# Patient Record
Sex: Female | Born: 1960 | Race: White | Hispanic: No | Smoking: Current every day smoker
Health system: Southern US, Community
[De-identification: ages and names within clinical notes are randomized; demographics above are authoritative.]

## PROBLEM LIST (undated history)

## (undated) ENCOUNTER — Emergency Department (HOSPITAL_COMMUNITY): Admission: EM | Payer: Self-pay | Source: Home / Self Care

## (undated) DIAGNOSIS — F32A Depression, unspecified: Secondary | ICD-10-CM

## (undated) DIAGNOSIS — M549 Dorsalgia, unspecified: Secondary | ICD-10-CM

## (undated) DIAGNOSIS — M51369 Other intervertebral disc degeneration, lumbar region without mention of lumbar back pain or lower extremity pain: Secondary | ICD-10-CM

## (undated) DIAGNOSIS — J449 Chronic obstructive pulmonary disease, unspecified: Secondary | ICD-10-CM

## (undated) DIAGNOSIS — F329 Major depressive disorder, single episode, unspecified: Secondary | ICD-10-CM

## (undated) DIAGNOSIS — M5136 Other intervertebral disc degeneration, lumbar region: Secondary | ICD-10-CM

## (undated) HISTORY — PX: CHOLECYSTECTOMY: SHX55

## (undated) HISTORY — PX: ABDOMINAL HYSTERECTOMY: SHX81

---

## 2008-03-13 ENCOUNTER — Emergency Department (HOSPITAL_COMMUNITY): Admission: EM | Admit: 2008-03-13 | Discharge: 2008-03-13 | Payer: Self-pay | Admitting: Emergency Medicine

## 2009-03-04 ENCOUNTER — Emergency Department (HOSPITAL_COMMUNITY): Admission: EM | Admit: 2009-03-04 | Discharge: 2009-03-04 | Payer: Self-pay | Admitting: Emergency Medicine

## 2009-03-20 ENCOUNTER — Emergency Department (HOSPITAL_COMMUNITY): Admission: EM | Admit: 2009-03-20 | Discharge: 2009-03-20 | Payer: Self-pay | Admitting: Emergency Medicine

## 2009-04-23 ENCOUNTER — Emergency Department (HOSPITAL_COMMUNITY): Admission: EM | Admit: 2009-04-23 | Discharge: 2009-04-24 | Payer: Self-pay | Admitting: Emergency Medicine

## 2009-06-26 ENCOUNTER — Emergency Department (HOSPITAL_COMMUNITY): Admission: EM | Admit: 2009-06-26 | Discharge: 2009-06-27 | Payer: Self-pay | Admitting: Emergency Medicine

## 2009-07-21 ENCOUNTER — Emergency Department (HOSPITAL_COMMUNITY): Admission: EM | Admit: 2009-07-21 | Discharge: 2009-07-22 | Payer: Self-pay | Admitting: Emergency Medicine

## 2009-07-30 ENCOUNTER — Emergency Department (HOSPITAL_COMMUNITY): Admission: EM | Admit: 2009-07-30 | Discharge: 2009-07-31 | Payer: Self-pay | Admitting: Emergency Medicine

## 2009-09-04 ENCOUNTER — Emergency Department (HOSPITAL_COMMUNITY): Admission: EM | Admit: 2009-09-04 | Discharge: 2009-09-04 | Payer: Self-pay | Admitting: Emergency Medicine

## 2009-09-07 ENCOUNTER — Ambulatory Visit: Payer: Self-pay | Admitting: Internal Medicine

## 2009-09-07 ENCOUNTER — Inpatient Hospital Stay (HOSPITAL_COMMUNITY): Admission: EM | Admit: 2009-09-07 | Discharge: 2009-09-07 | Payer: Self-pay | Admitting: Emergency Medicine

## 2009-10-08 ENCOUNTER — Emergency Department (HOSPITAL_COMMUNITY): Admission: EM | Admit: 2009-10-08 | Discharge: 2009-10-09 | Payer: Self-pay | Admitting: Emergency Medicine

## 2009-11-09 ENCOUNTER — Emergency Department (HOSPITAL_COMMUNITY): Admission: EM | Admit: 2009-11-09 | Discharge: 2009-11-09 | Payer: Self-pay | Admitting: Emergency Medicine

## 2009-11-23 ENCOUNTER — Emergency Department (HOSPITAL_COMMUNITY): Admission: EM | Admit: 2009-11-23 | Discharge: 2009-11-23 | Payer: Self-pay | Admitting: Emergency Medicine

## 2009-12-03 ENCOUNTER — Emergency Department (HOSPITAL_COMMUNITY): Admission: EM | Admit: 2009-12-03 | Discharge: 2009-12-03 | Payer: Self-pay | Admitting: Emergency Medicine

## 2009-12-19 ENCOUNTER — Emergency Department (HOSPITAL_COMMUNITY): Admission: EM | Admit: 2009-12-19 | Discharge: 2009-12-19 | Payer: Self-pay | Admitting: Emergency Medicine

## 2010-01-01 ENCOUNTER — Emergency Department (HOSPITAL_COMMUNITY): Admission: EM | Admit: 2010-01-01 | Discharge: 2010-01-01 | Payer: Self-pay | Admitting: Emergency Medicine

## 2010-01-01 ENCOUNTER — Encounter (INDEPENDENT_AMBULATORY_CARE_PROVIDER_SITE_OTHER): Payer: Self-pay | Admitting: Emergency Medicine

## 2010-01-01 ENCOUNTER — Ambulatory Visit: Payer: Self-pay | Admitting: Vascular Surgery

## 2010-01-13 ENCOUNTER — Emergency Department (HOSPITAL_COMMUNITY): Admission: EM | Admit: 2010-01-13 | Discharge: 2010-01-13 | Payer: Self-pay | Admitting: Emergency Medicine

## 2010-01-22 ENCOUNTER — Emergency Department (HOSPITAL_COMMUNITY): Admission: EM | Admit: 2010-01-22 | Discharge: 2010-01-23 | Payer: Self-pay | Admitting: Emergency Medicine

## 2010-03-18 ENCOUNTER — Emergency Department (HOSPITAL_COMMUNITY): Admission: EM | Admit: 2010-03-18 | Discharge: 2010-03-18 | Payer: Self-pay | Admitting: Emergency Medicine

## 2010-03-25 ENCOUNTER — Emergency Department (HOSPITAL_COMMUNITY): Admission: EM | Admit: 2010-03-25 | Discharge: 2010-03-25 | Payer: Self-pay | Admitting: Emergency Medicine

## 2010-04-12 ENCOUNTER — Emergency Department (HOSPITAL_COMMUNITY): Admission: EM | Admit: 2010-04-12 | Discharge: 2010-04-12 | Payer: Self-pay | Admitting: Emergency Medicine

## 2010-04-22 ENCOUNTER — Emergency Department (HOSPITAL_COMMUNITY): Admission: EM | Admit: 2010-04-22 | Discharge: 2010-04-22 | Payer: Self-pay | Admitting: Emergency Medicine

## 2010-12-04 ENCOUNTER — Emergency Department (HOSPITAL_COMMUNITY)
Admission: EM | Admit: 2010-12-04 | Discharge: 2010-12-05 | Disposition: A | Discharge status: Home / Self Care | Payer: Self-pay | Attending: Emergency Medicine | Admitting: Emergency Medicine

## 2010-12-04 DIAGNOSIS — M79609 Pain in unspecified limb: Secondary | ICD-10-CM | POA: Insufficient documentation

## 2010-12-04 DIAGNOSIS — J438 Other emphysema: Secondary | ICD-10-CM | POA: Insufficient documentation

## 2010-12-04 DIAGNOSIS — M545 Low back pain, unspecified: Secondary | ICD-10-CM | POA: Insufficient documentation

## 2010-12-04 DIAGNOSIS — Z79899 Other long term (current) drug therapy: Secondary | ICD-10-CM | POA: Insufficient documentation

## 2010-12-04 DIAGNOSIS — F3130 Bipolar disorder, current episode depressed, mild or moderate severity, unspecified: Secondary | ICD-10-CM | POA: Insufficient documentation

## 2010-12-04 DIAGNOSIS — G8929 Other chronic pain: Secondary | ICD-10-CM | POA: Insufficient documentation

## 2010-12-10 ENCOUNTER — Emergency Department (HOSPITAL_COMMUNITY)
Admission: EM | Admit: 2010-12-10 | Discharge: 2010-12-10 | Disposition: A | Discharge status: Home / Self Care | Payer: Self-pay | Attending: Emergency Medicine | Admitting: Emergency Medicine

## 2010-12-10 DIAGNOSIS — M79609 Pain in unspecified limb: Secondary | ICD-10-CM | POA: Insufficient documentation

## 2010-12-10 LAB — URINALYSIS, ROUTINE W REFLEX MICROSCOPIC
Bilirubin Urine: NEGATIVE
Ketones, ur: NEGATIVE mg/dL
Protein, ur: NEGATIVE mg/dL
Urine Glucose, Fasting: NEGATIVE mg/dL
Urobilinogen, UA: 0.2 mg/dL (ref 0.0–1.0)

## 2010-12-10 LAB — URINE MICROSCOPIC-ADD ON

## 2010-12-15 ENCOUNTER — Emergency Department (HOSPITAL_COMMUNITY)
Admission: EM | Admit: 2010-12-15 | Discharge: 2010-12-16 | Disposition: A | Discharge status: Home / Self Care | Payer: Self-pay | Attending: Emergency Medicine | Admitting: Emergency Medicine

## 2010-12-15 ENCOUNTER — Emergency Department: Payer: Self-pay | Admitting: Emergency Medicine

## 2010-12-28 LAB — CBC
HCT: 38.9 % (ref 36.0–46.0)
Hemoglobin: 13.5 g/dL (ref 12.0–15.0)
WBC: 8.6 10*3/uL (ref 4.0–10.5)

## 2010-12-28 LAB — PROTIME-INR
INR: 0.85 (ref 0.00–1.49)
Prothrombin Time: 11.5 seconds — ABNORMAL LOW (ref 11.6–15.2)

## 2010-12-28 LAB — DIFFERENTIAL
Basophils Absolute: 0.1 10*3/uL (ref 0.0–0.1)
Basophils Relative: 1 % (ref 0–1)
Eosinophils Absolute: 0.3 10*3/uL (ref 0.0–0.7)
Eosinophils Relative: 4 % (ref 0–5)
Monocytes Absolute: 0.5 10*3/uL (ref 0.1–1.0)

## 2010-12-28 LAB — POCT I-STAT, CHEM 8
Calcium, Ion: 1.17 mmol/L (ref 1.12–1.32)
Glucose, Bld: 95 mg/dL (ref 70–99)
HCT: 39 % (ref 36.0–46.0)
Hemoglobin: 13.3 g/dL (ref 12.0–15.0)
TCO2: 26 mmol/L (ref 0–100)

## 2010-12-28 LAB — D-DIMER, QUANTITATIVE: D-Dimer, Quant: <0.22 ug/mL-FEU (ref 0.00–0.48)

## 2010-12-29 LAB — URINALYSIS, ROUTINE W REFLEX MICROSCOPIC
Bilirubin Urine: NEGATIVE
Ketones, ur: NEGATIVE mg/dL
Nitrite: NEGATIVE
Specific Gravity, Urine: 1.004 — ABNORMAL LOW (ref 1.005–1.030)
Urobilinogen, UA: 0.2 mg/dL (ref 0.0–1.0)
pH: 6.5 (ref 5.0–8.0)

## 2010-12-29 LAB — CBC
Hemoglobin: 13.7 g/dL (ref 12.0–15.0)
MCHC: 35 g/dL (ref 30.0–36.0)
MCV: 87.3 fL (ref 78.0–100.0)
RBC: 4.47 MIL/uL (ref 3.87–5.11)

## 2010-12-29 LAB — POCT I-STAT, CHEM 8
Creatinine, Ser: 0.7 mg/dL (ref 0.4–1.2)
Hemoglobin: 13.6 g/dL (ref 12.0–15.0)
Sodium: 141 mEq/L (ref 135–145)
TCO2: 25 mmol/L (ref 0–100)

## 2010-12-29 LAB — DIFFERENTIAL
Basophils Relative: 1 % (ref 0–1)
Eosinophils Absolute: 0.3 10*3/uL (ref 0.0–0.7)
Monocytes Absolute: 0.5 10*3/uL (ref 0.1–1.0)
Monocytes Relative: 5 % (ref 3–12)

## 2010-12-29 LAB — HEPATIC FUNCTION PANEL
AST: 35 U/L (ref 0–37)
Bilirubin, Direct: <0.1 mg/dL (ref 0.0–0.3)
Total Bilirubin: 0.3 mg/dL (ref 0.3–1.2)

## 2010-12-29 LAB — URINE MICROSCOPIC-ADD ON

## 2010-12-29 LAB — LIPASE, BLOOD: Lipase: 22 U/L (ref 11–59)

## 2011-01-05 ENCOUNTER — Emergency Department (HOSPITAL_COMMUNITY)
Admission: EM | Admit: 2011-01-05 | Discharge: 2011-01-05 | Disposition: A | Discharge status: Home / Self Care | Payer: Self-pay | Attending: Emergency Medicine | Admitting: Emergency Medicine

## 2011-01-05 DIAGNOSIS — Z79899 Other long term (current) drug therapy: Secondary | ICD-10-CM | POA: Insufficient documentation

## 2011-01-05 DIAGNOSIS — F3130 Bipolar disorder, current episode depressed, mild or moderate severity, unspecified: Secondary | ICD-10-CM | POA: Insufficient documentation

## 2011-01-05 DIAGNOSIS — J438 Other emphysema: Secondary | ICD-10-CM | POA: Insufficient documentation

## 2011-01-05 DIAGNOSIS — M5430 Sciatica, unspecified side: Secondary | ICD-10-CM | POA: Insufficient documentation

## 2011-01-05 DIAGNOSIS — M549 Dorsalgia, unspecified: Secondary | ICD-10-CM | POA: Insufficient documentation

## 2011-01-12 LAB — COMPREHENSIVE METABOLIC PANEL
Alkaline Phosphatase: 104 U/L (ref 39–117)
BUN: 15 mg/dL (ref 6–23)
CO2: 22 mEq/L (ref 19–32)
Chloride: 104 mEq/L (ref 96–112)
Creatinine, Ser: 0.78 mg/dL (ref 0.4–1.2)
GFR calc non Af Amer: >60 mL/min (ref >60)
Glucose, Bld: 134 mg/dL — ABNORMAL HIGH (ref 70–99)
Total Bilirubin: 0.7 mg/dL (ref 0.3–1.2)

## 2011-01-12 LAB — DIFFERENTIAL
Basophils Absolute: 0.1 10*3/uL (ref 0.0–0.1)
Basophils Relative: 1 % (ref 0–1)
Lymphocytes Relative: 27 % (ref 12–46)
Neutro Abs: 8.7 10*3/uL — ABNORMAL HIGH (ref 1.7–7.7)
Neutrophils Relative %: 64 % (ref 43–77)

## 2011-01-12 LAB — CBC
HCT: 38.5 % (ref 36.0–46.0)
Hemoglobin: 13.7 g/dL (ref 12.0–15.0)
MCV: 88 fL (ref 78.0–100.0)
Platelets: 319 10*3/uL (ref 150–400)
RBC: 4.37 MIL/uL (ref 3.87–5.11)
WBC: 13.6 10*3/uL — ABNORMAL HIGH (ref 4.0–10.5)

## 2011-01-12 LAB — URINALYSIS, ROUTINE W REFLEX MICROSCOPIC
Ketones, ur: NEGATIVE mg/dL
Protein, ur: NEGATIVE mg/dL
Urobilinogen, UA: 0.2 mg/dL (ref 0.0–1.0)

## 2011-01-12 LAB — URINE MICROSCOPIC-ADD ON

## 2011-01-14 LAB — CARDIAC PANEL(CRET KIN+CKTOT+MB+TROPI)
CK, MB: 1.1 ng/mL (ref 0.3–4.0)
Total CK: 50 U/L (ref 7–177)

## 2011-01-14 LAB — DIFFERENTIAL
Basophils Absolute: 0 K/uL (ref 0.0–0.1)
Basophils Relative: 0 % (ref 0–1)
Eosinophils Absolute: 0.2 10*3/uL (ref 0.0–0.7)
Eosinophils Relative: 3 % (ref 0–5)
Eosinophils Relative: 2 % (ref 0–5)
Lymphocytes Relative: 22 % (ref 12–46)
Lymphocytes Relative: 29 % (ref 12–46)
Lymphs Abs: 1.8 10*3/uL (ref 0.7–4.0)
Lymphs Abs: 3.1 10*3/uL (ref 0.7–4.0)
Monocytes Absolute: 0.5 10*3/uL (ref 0.1–1.0)
Monocytes Absolute: 0.6 K/uL (ref 0.1–1.0)
Monocytes Relative: 5 % (ref 3–12)
Neutro Abs: 7 K/uL (ref 1.7–7.7)
Neutrophils Relative %: 64 % (ref 43–77)

## 2011-01-14 LAB — URINALYSIS, ROUTINE W REFLEX MICROSCOPIC
Bilirubin Urine: NEGATIVE
Glucose, UA: NEGATIVE mg/dL
Ketones, ur: NEGATIVE mg/dL
Protein, ur: NEGATIVE mg/dL
pH: 5.5 (ref 5.0–8.0)

## 2011-01-14 LAB — URINE CULTURE
Colony Count: >=100000
Special Requests: NEGATIVE

## 2011-01-14 LAB — CBC
HCT: 35 % — ABNORMAL LOW (ref 36.0–46.0)
HCT: 35.5 % — ABNORMAL LOW (ref 36.0–46.0)
HCT: 37.8 % (ref 36.0–46.0)
Hemoglobin: 12.4 g/dL (ref 12.0–15.0)
Hemoglobin: 12.6 g/dL (ref 12.0–15.0)
Hemoglobin: 13.1 g/dL (ref 12.0–15.0)
MCHC: 35.4 g/dL (ref 30.0–36.0)
MCHC: 34.6 g/dL (ref 30.0–36.0)
MCV: 88.9 fL (ref 78.0–100.0)
Platelets: 250 10*3/uL (ref 150–400)
Platelets: 297 K/uL (ref 150–400)
RBC: 3.96 MIL/uL (ref 3.87–5.11)
RBC: 4.24 MIL/uL (ref 3.87–5.11)
RDW: 12.9 % (ref 11.5–15.5)
RDW: 13 % (ref 11.5–15.5)
RDW: 13.3 % (ref 11.5–15.5)
WBC: 8.5 10*3/uL (ref 4.0–10.5)
WBC: 10.9 10*3/uL — ABNORMAL HIGH (ref 4.0–10.5)

## 2011-01-14 LAB — COMPREHENSIVE METABOLIC PANEL
AST: 24 U/L (ref 0–37)
Albumin: 3.3 g/dL — ABNORMAL LOW (ref 3.5–5.2)
Calcium: 8.3 mg/dL — ABNORMAL LOW (ref 8.4–10.5)
Creatinine, Ser: 0.76 mg/dL (ref 0.4–1.2)
GFR calc non Af Amer: >60 mL/min (ref >60)
Sodium: 138 mEq/L (ref 135–145)

## 2011-01-14 LAB — BRAIN NATRIURETIC PEPTIDE
Pro B Natriuretic peptide (BNP): 48 pg/mL (ref 0.0–100.0)
Pro B Natriuretic peptide (BNP): 76 pg/mL (ref 0.0–100.0)

## 2011-01-14 LAB — POCT CARDIAC MARKERS
CKMB, poc: <1 ng/mL — ABNORMAL LOW (ref 1.0–8.0)
Myoglobin, poc: 31 ng/mL (ref 12–200)
Myoglobin, poc: 39.2 ng/mL (ref 12–200)
Troponin i, poc: <0.05 ng/mL (ref 0.00–0.09)
Troponin i, poc: <0.05 ng/mL (ref 0.00–0.09)

## 2011-01-14 LAB — POCT I-STAT, CHEM 8
BUN: 17 mg/dL (ref 6–23)
Calcium, Ion: 1.03 mmol/L — ABNORMAL LOW (ref 1.12–1.32)
Chloride: 107 mEq/L (ref 96–112)
Creatinine, Ser: 0.9 mg/dL (ref 0.4–1.2)
Glucose, Bld: 123 mg/dL — ABNORMAL HIGH (ref 70–99)
HCT: 37 % (ref 36.0–46.0)
Hemoglobin: 12.6 g/dL (ref 12.0–15.0)
Potassium: 3.7 meq/L (ref 3.5–5.1)
Sodium: 139 mEq/L (ref 135–145)
TCO2: 22 mmol/L (ref 0–100)

## 2011-01-14 LAB — HEMOGLOBIN A1C: Mean Plasma Glucose: 120 mg/dL

## 2011-01-14 LAB — LIPID PANEL
Cholesterol: 243 mg/dL — ABNORMAL HIGH (ref 0–200)
LDL Cholesterol: 164 mg/dL — ABNORMAL HIGH (ref 0–99)
Total CHOL/HDL Ratio: 5.8 RATIO

## 2011-01-14 LAB — URINE MICROSCOPIC-ADD ON

## 2011-01-14 LAB — POCT PREGNANCY, URINE: Preg Test, Ur: NEGATIVE

## 2011-01-14 LAB — D-DIMER, QUANTITATIVE: D-Dimer, Quant: 0.52 ug/mL-FEU — ABNORMAL HIGH (ref 0.00–0.48)

## 2011-01-15 LAB — POCT CARDIAC MARKERS
CKMB, poc: <1 ng/mL — ABNORMAL LOW (ref 1.0–8.0)
Myoglobin, poc: 39.9 ng/mL (ref 12–200)
Troponin i, poc: <0.05 ng/mL (ref 0.00–0.09)

## 2011-01-15 LAB — COMPREHENSIVE METABOLIC PANEL
AST: 15 U/L (ref 0–37)
Albumin: 3.3 g/dL — ABNORMAL LOW (ref 3.5–5.2)
Alkaline Phosphatase: 85 U/L (ref 39–117)
CO2: 30 mEq/L (ref 19–32)
Chloride: 107 mEq/L (ref 96–112)
Creatinine, Ser: 0.98 mg/dL (ref 0.4–1.2)
GFR calc Af Amer: >60 mL/min (ref >60)
GFR calc non Af Amer: >60 mL/min (ref >60)
Potassium: 4.3 mEq/L (ref 3.5–5.1)
Total Bilirubin: 0.4 mg/dL (ref 0.3–1.2)

## 2011-01-15 LAB — CBC
HCT: 36.5 % (ref 36.0–46.0)
MCV: 89.8 fL (ref 78.0–100.0)
Platelets: 282 10*3/uL (ref 150–400)
RBC: 4.07 MIL/uL (ref 3.87–5.11)
WBC: 11.5 10*3/uL — ABNORMAL HIGH (ref 4.0–10.5)

## 2011-01-15 LAB — DIFFERENTIAL
Basophils Absolute: 0 10*3/uL (ref 0.0–0.1)
Basophils Relative: 0 % (ref 0–1)
Eosinophils Absolute: 0.2 10*3/uL (ref 0.0–0.7)
Eosinophils Relative: 2 % (ref 0–5)
Lymphocytes Relative: 31 % (ref 12–46)
Monocytes Absolute: 0.5 10*3/uL (ref 0.1–1.0)

## 2011-01-15 LAB — D-DIMER, QUANTITATIVE: D-Dimer, Quant: 0.34 ug/mL-FEU (ref 0.00–0.48)

## 2011-01-16 ENCOUNTER — Emergency Department (HOSPITAL_COMMUNITY)
Admission: EM | Admit: 2011-01-16 | Discharge: 2011-01-16 | Disposition: A | Discharge status: Home / Self Care | Payer: Self-pay | Attending: Emergency Medicine | Admitting: Emergency Medicine

## 2011-01-16 DIAGNOSIS — M545 Low back pain, unspecified: Secondary | ICD-10-CM | POA: Insufficient documentation

## 2011-01-16 DIAGNOSIS — J438 Other emphysema: Secondary | ICD-10-CM | POA: Insufficient documentation

## 2011-01-16 DIAGNOSIS — G8929 Other chronic pain: Secondary | ICD-10-CM | POA: Insufficient documentation

## 2011-01-16 DIAGNOSIS — R32 Unspecified urinary incontinence: Secondary | ICD-10-CM | POA: Insufficient documentation

## 2011-01-16 DIAGNOSIS — Z79899 Other long term (current) drug therapy: Secondary | ICD-10-CM | POA: Insufficient documentation

## 2011-01-16 DIAGNOSIS — F3130 Bipolar disorder, current episode depressed, mild or moderate severity, unspecified: Secondary | ICD-10-CM | POA: Insufficient documentation

## 2011-03-05 ENCOUNTER — Emergency Department (HOSPITAL_COMMUNITY): Payer: Self-pay

## 2011-03-05 ENCOUNTER — Emergency Department (HOSPITAL_COMMUNITY)
Admission: EM | Admit: 2011-03-05 | Discharge: 2011-03-05 | Discharge status: Against Medical Advice | Payer: Self-pay | Attending: Emergency Medicine | Admitting: Emergency Medicine

## 2011-03-05 DIAGNOSIS — G8929 Other chronic pain: Secondary | ICD-10-CM | POA: Insufficient documentation

## 2011-03-05 DIAGNOSIS — R29898 Other symptoms and signs involving the musculoskeletal system: Secondary | ICD-10-CM | POA: Insufficient documentation

## 2011-03-05 DIAGNOSIS — M546 Pain in thoracic spine: Secondary | ICD-10-CM | POA: Insufficient documentation

## 2011-03-05 DIAGNOSIS — M545 Low back pain, unspecified: Secondary | ICD-10-CM | POA: Insufficient documentation

## 2011-03-05 DIAGNOSIS — J438 Other emphysema: Secondary | ICD-10-CM | POA: Insufficient documentation

## 2011-03-05 DIAGNOSIS — F3130 Bipolar disorder, current episode depressed, mild or moderate severity, unspecified: Secondary | ICD-10-CM | POA: Insufficient documentation

## 2011-03-05 DIAGNOSIS — R159 Full incontinence of feces: Secondary | ICD-10-CM | POA: Insufficient documentation

## 2011-03-05 LAB — URINE MICROSCOPIC-ADD ON

## 2011-03-05 LAB — URINALYSIS, ROUTINE W REFLEX MICROSCOPIC
Bilirubin Urine: NEGATIVE
Glucose, UA: NEGATIVE mg/dL
Ketones, ur: NEGATIVE mg/dL
Nitrite: POSITIVE — AB
Protein, ur: NEGATIVE mg/dL
Specific Gravity, Urine: 1.024 (ref 1.005–1.030)
Urobilinogen, UA: 0.2 mg/dL (ref 0.0–1.0)
pH: 6 (ref 5.0–8.0)

## 2011-03-05 LAB — RAPID URINE DRUG SCREEN, HOSP PERFORMED
Amphetamines: NOT DETECTED
Barbiturates: NOT DETECTED
Benzodiazepines: NOT DETECTED
Cocaine: NOT DETECTED
Opiates: NOT DETECTED
Tetrahydrocannabinol: NOT DETECTED

## 2011-03-05 LAB — POCT I-STAT, CHEM 8
BUN: 9 mg/dL (ref 6–23)
Calcium, Ion: 1.1 mmol/L — ABNORMAL LOW (ref 1.12–1.32)
Chloride: 108 mEq/L (ref 96–112)
Creatinine, Ser: 1 mg/dL (ref 0.4–1.2)
Glucose, Bld: 105 mg/dL — ABNORMAL HIGH (ref 70–99)
HCT: 38 % (ref 36.0–46.0)
Hemoglobin: 12.9 g/dL (ref 12.0–15.0)
Potassium: 3.5 mEq/L (ref 3.5–5.1)
Sodium: 143 mEq/L (ref 135–145)
TCO2: 24 mmol/L (ref 0–100)

## 2011-03-07 LAB — URINE CULTURE
Colony Count: >=100000
Culture  Setup Time: 201205242355

## 2011-03-12 ENCOUNTER — Emergency Department (HOSPITAL_COMMUNITY)
Admission: EM | Admit: 2011-03-12 | Discharge: 2011-03-12 | Disposition: A | Discharge status: Home / Self Care | Payer: Self-pay | Attending: Emergency Medicine | Admitting: Emergency Medicine

## 2011-03-12 DIAGNOSIS — J438 Other emphysema: Secondary | ICD-10-CM | POA: Insufficient documentation

## 2011-03-12 DIAGNOSIS — F3130 Bipolar disorder, current episode depressed, mild or moderate severity, unspecified: Secondary | ICD-10-CM | POA: Insufficient documentation

## 2011-03-12 DIAGNOSIS — M545 Low back pain, unspecified: Secondary | ICD-10-CM | POA: Insufficient documentation

## 2011-03-12 DIAGNOSIS — E669 Obesity, unspecified: Secondary | ICD-10-CM | POA: Insufficient documentation

## 2011-03-12 DIAGNOSIS — R209 Unspecified disturbances of skin sensation: Secondary | ICD-10-CM | POA: Insufficient documentation

## 2011-03-12 DIAGNOSIS — G8929 Other chronic pain: Secondary | ICD-10-CM | POA: Insufficient documentation

## 2011-04-01 ENCOUNTER — Emergency Department (HOSPITAL_COMMUNITY): Payer: Self-pay

## 2011-04-01 ENCOUNTER — Emergency Department (HOSPITAL_COMMUNITY)
Admission: EM | Admit: 2011-04-01 | Discharge: 2011-04-01 | Disposition: A | Discharge status: Home / Self Care | Payer: Self-pay | Attending: Emergency Medicine | Admitting: Emergency Medicine

## 2011-04-01 DIAGNOSIS — R0602 Shortness of breath: Secondary | ICD-10-CM | POA: Insufficient documentation

## 2011-04-01 DIAGNOSIS — J4489 Other specified chronic obstructive pulmonary disease: Secondary | ICD-10-CM | POA: Insufficient documentation

## 2011-04-01 DIAGNOSIS — J449 Chronic obstructive pulmonary disease, unspecified: Secondary | ICD-10-CM | POA: Insufficient documentation

## 2011-04-01 DIAGNOSIS — M546 Pain in thoracic spine: Secondary | ICD-10-CM | POA: Insufficient documentation

## 2011-04-01 DIAGNOSIS — R059 Cough, unspecified: Secondary | ICD-10-CM | POA: Insufficient documentation

## 2011-04-01 DIAGNOSIS — R05 Cough: Secondary | ICD-10-CM | POA: Insufficient documentation

## 2011-04-01 DIAGNOSIS — R609 Edema, unspecified: Secondary | ICD-10-CM | POA: Insufficient documentation

## 2011-04-01 DIAGNOSIS — G8929 Other chronic pain: Secondary | ICD-10-CM | POA: Insufficient documentation

## 2011-04-01 DIAGNOSIS — R079 Chest pain, unspecified: Secondary | ICD-10-CM | POA: Insufficient documentation

## 2011-04-01 DIAGNOSIS — F3130 Bipolar disorder, current episode depressed, mild or moderate severity, unspecified: Secondary | ICD-10-CM | POA: Insufficient documentation

## 2011-04-01 LAB — CBC
HCT: 40.8 % (ref 36.0–46.0)
Hemoglobin: 13.8 g/dL (ref 12.0–15.0)
MCH: 29.5 pg (ref 26.0–34.0)
MCV: 87.2 fL (ref 78.0–100.0)
RBC: 4.68 MIL/uL (ref 3.87–5.11)

## 2011-04-01 LAB — POCT I-STAT, CHEM 8
BUN: 10 mg/dL (ref 6–23)
Chloride: 107 mEq/L (ref 96–112)
Creatinine, Ser: 0.9 mg/dL (ref 0.50–1.10)
Sodium: 141 mEq/L (ref 135–145)

## 2011-04-01 LAB — TROPONIN I: Troponin I: <0.3 ng/mL (ref <0.30)

## 2011-04-01 LAB — DIFFERENTIAL
Lymphs Abs: 3.7 10*3/uL (ref 0.7–4.0)
Monocytes Relative: 5 % (ref 3–12)
Neutro Abs: 6 10*3/uL (ref 1.7–7.7)
Neutrophils Relative %: 58 % (ref 43–77)

## 2011-06-04 IMAGING — CR DG KNEE COMPLETE 4+V*R*
7 series · 7 of 7 positions shown · non-contrast
Comparison: 01/01/2010

CLINICAL DATA: Right knee giving out on her for 6 months.  Anterior
knee pain.  Tibial rod 0440 after trauma.

RIGHT KNEE - COMPLETE 4+ VIEW

[t knee ap right (1 of 3)]
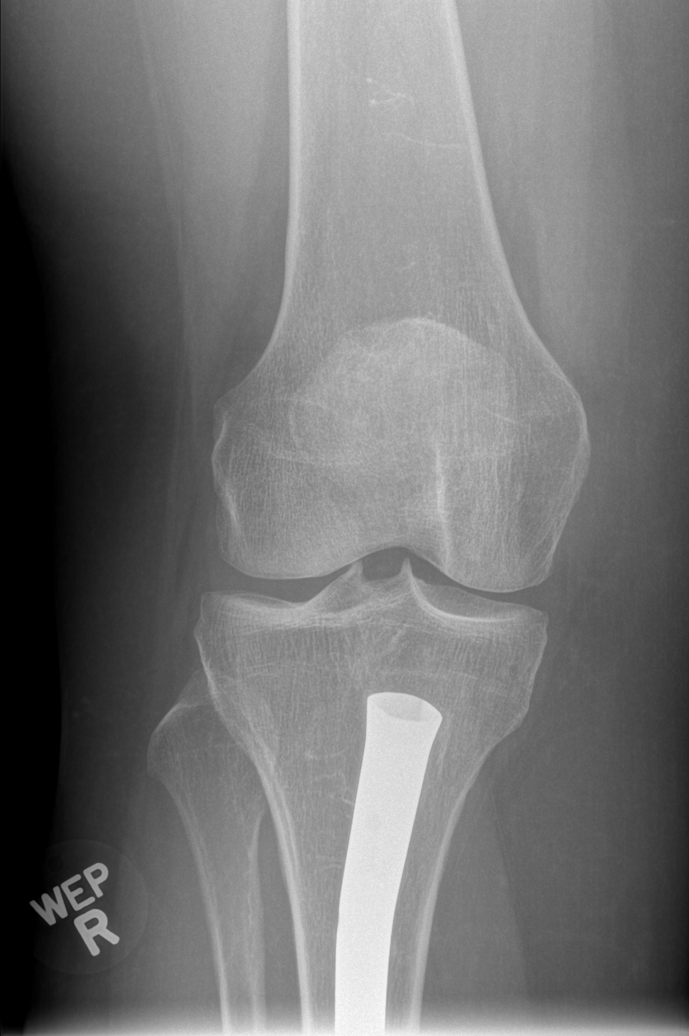

[t knee ap right (2 of 3)]
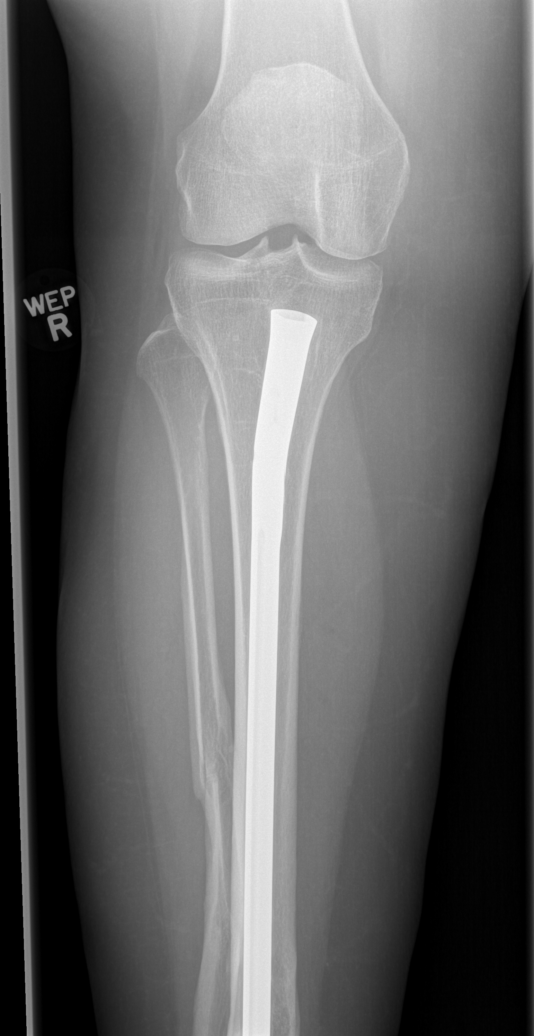

[t knee ap right (3 of 3)]
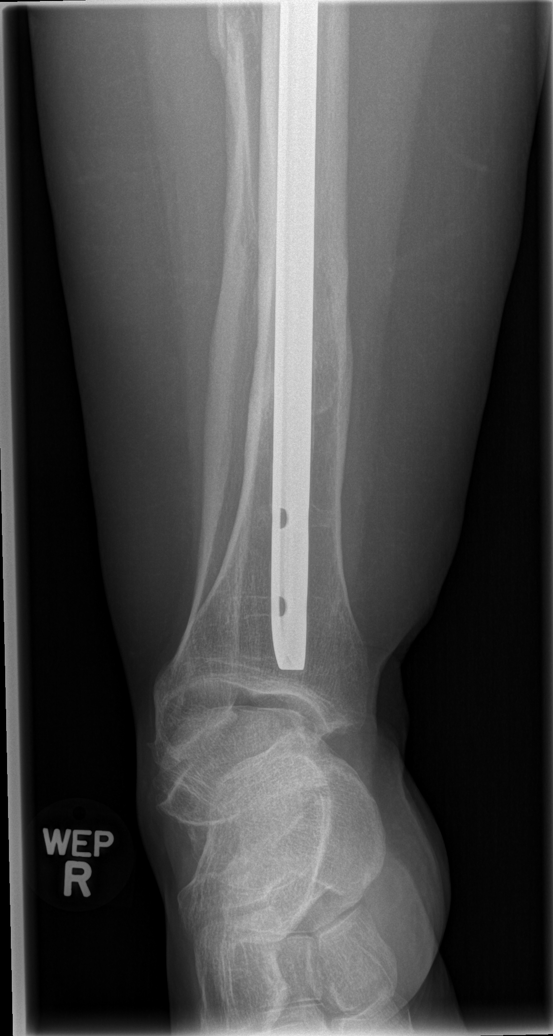

[t knee oblique right (1 of 2)]
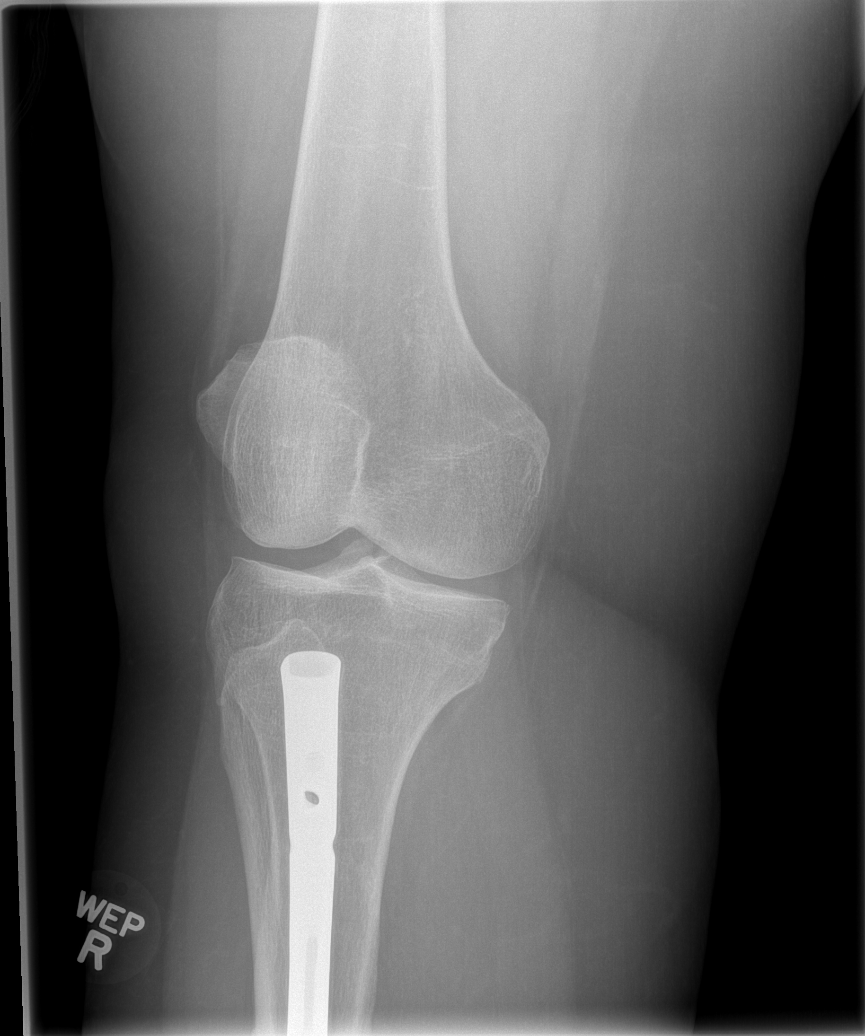

[t knee oblique right (2 of 2)]
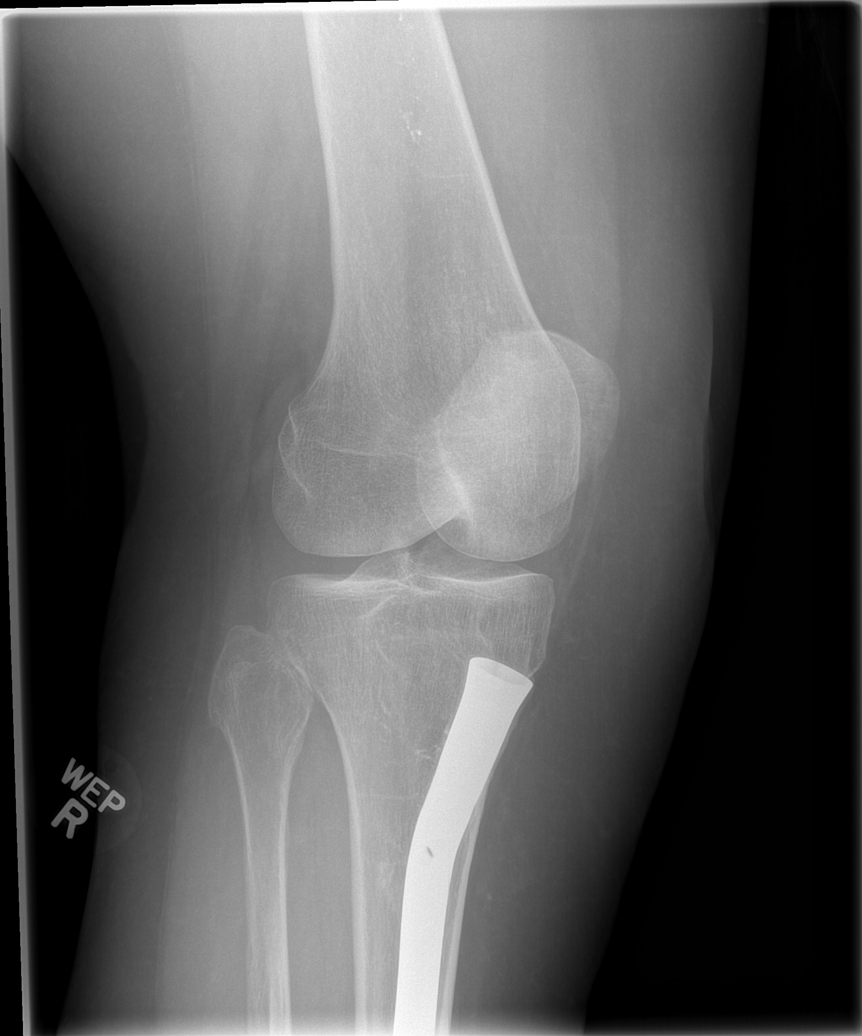

[t knee lat right (1 of 2)]
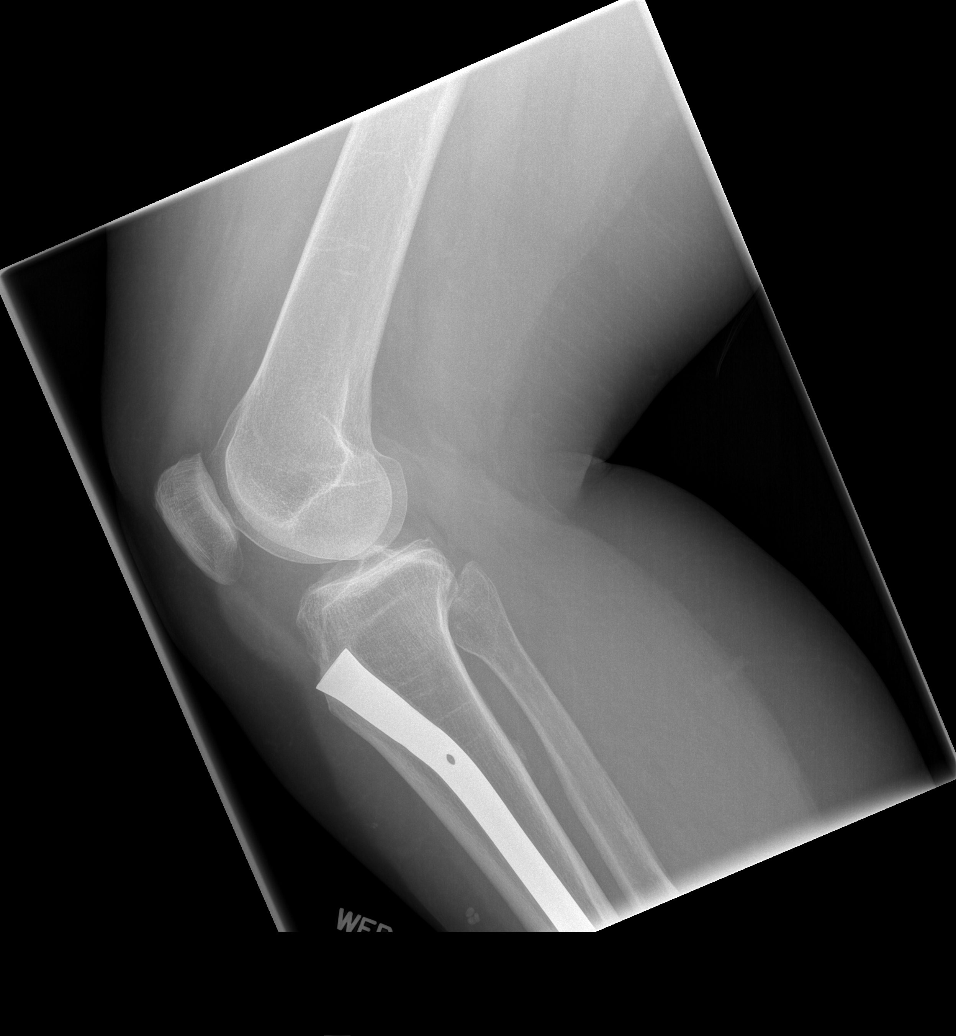

[t knee lat right (2 of 2)]
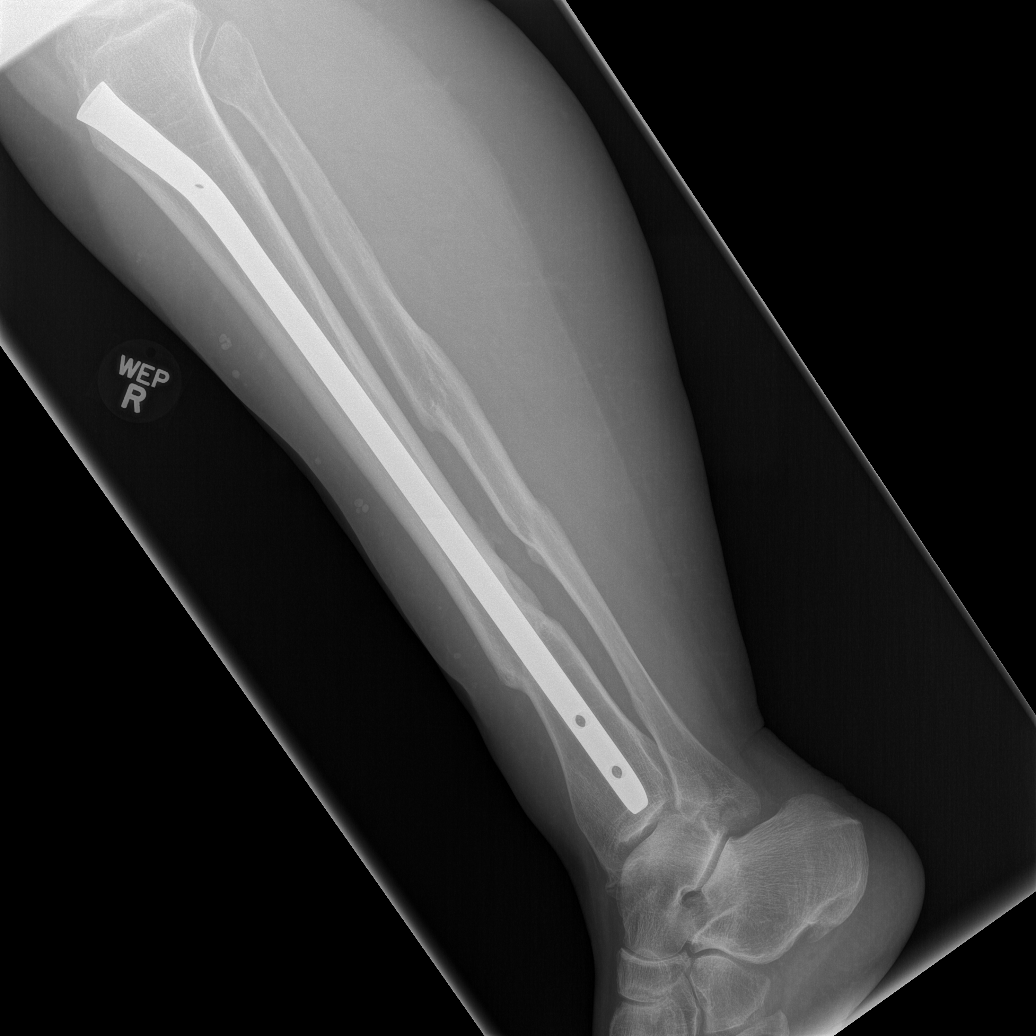

[7 of 7 positions shown; findings below may reference images not displayed]

FINDINGS: Four views are performed, showing tibial rod traversing
the length of the tibia.  There is deformity of the tibia and
fibula consistent with previous fractures.  There is no evidence
for acute fracture or subluxation.  No evidence for joint effusion.
IMPRESSION: Postoperative changes. No evidence for acute  abnormality.

## 2011-07-15 ENCOUNTER — Emergency Department (HOSPITAL_COMMUNITY)
Admission: EM | Admit: 2011-07-15 | Discharge: 2011-07-15 | Disposition: A | Discharge status: Home / Self Care | Payer: Self-pay | Attending: Emergency Medicine | Admitting: Emergency Medicine

## 2011-07-15 ENCOUNTER — Encounter: Payer: Self-pay | Admitting: Emergency Medicine

## 2011-07-15 DIAGNOSIS — M545 Low back pain, unspecified: Secondary | ICD-10-CM | POA: Insufficient documentation

## 2011-07-15 DIAGNOSIS — M79609 Pain in unspecified limb: Secondary | ICD-10-CM | POA: Insufficient documentation

## 2011-07-15 DIAGNOSIS — F17200 Nicotine dependence, unspecified, uncomplicated: Secondary | ICD-10-CM | POA: Insufficient documentation

## 2011-07-15 DIAGNOSIS — M5416 Radiculopathy, lumbar region: Secondary | ICD-10-CM

## 2011-07-15 DIAGNOSIS — IMO0002 Reserved for concepts with insufficient information to code with codable children: Secondary | ICD-10-CM | POA: Insufficient documentation

## 2011-07-15 DIAGNOSIS — Z79899 Other long term (current) drug therapy: Secondary | ICD-10-CM | POA: Insufficient documentation

## 2011-07-15 HISTORY — DX: Major depressive disorder, single episode, unspecified: F32.9

## 2011-07-15 HISTORY — DX: Chronic obstructive pulmonary disease, unspecified: J44.9

## 2011-07-15 HISTORY — DX: Depression, unspecified: F32.A

## 2011-07-15 HISTORY — DX: Dorsalgia, unspecified: M54.9

## 2011-07-15 MED ORDER — OXYCODONE-ACETAMINOPHEN 5-325 MG PO TABS
1.0000 | ORAL_TABLET | Freq: Once | ORAL | Status: AC
  Administered 2011-07-15: 1 via ORAL
  Filled 2011-07-15: qty 1

## 2011-07-15 MED ORDER — CYCLOBENZAPRINE HCL 5 MG PO TABS: 5.0000 mg | ORAL_TABLET | Freq: Three times a day (TID) | ORAL | Status: AC | PRN

## 2011-07-15 MED ORDER — OXYCODONE-ACETAMINOPHEN 5-325 MG PO TABS: 1.0000 | ORAL_TABLET | ORAL | Status: AC | PRN

## 2011-07-15 MED ORDER — PREDNISONE 20 MG PO TABS: 60.0000 mg | ORAL_TABLET | Freq: Every day | ORAL | Status: AC

## 2011-07-15 MED ORDER — PREDNISONE 20 MG PO TABS
60.0000 mg | ORAL_TABLET | Freq: Once | ORAL | Status: AC
  Administered 2011-07-15: 60 mg via ORAL
  Filled 2011-07-15: qty 3

## 2011-07-15 NOTE — ED Notes (Signed)
Pt c/o chronic lower back pain with rt leg pain.

## 2011-07-15 NOTE — ED Notes (Signed)
Julie Idol, PA at bedside 

## 2011-07-15 NOTE — ED Notes (Signed)
Patient with no complaints at this time. Respirations even and unlabored. Skin warm/dry. Discharge instructions reviewed with patient at this time. Patient given opportunity to voice concerns/ask questions. Patient discharged at this time and left Emergency Department with steady gait.   

## 2011-07-15 NOTE — ED Provider Notes (Signed)
History     CSN: [id number] Arrival date & time: 07/15/2011  2:00 PM  Chief Complaint  Patient presents with  . Back Pain  . Leg Pain    (Consider location/radiation/quality/duration/timing/severity/associated sxs/prior treatment) Patient is a 50 y.o. female presenting with back pain and leg pain. The history is provided by the patient.  Back Pain  This is a recurrent problem. The current episode started 2 days ago. The problem occurs constantly. The problem has not changed since onset.The pain is associated with lifting heavy objects (She has had increased lifting and exertional activity with the care of her mother the past several days.  She has chronic low back pain with a known lumbar disk herniation.  ). The pain is present in the lumbar spine. The quality of the pain is described as aching and burning. The pain radiates to the right knee. The pain is at a severity of 8/10. The pain is moderate. The symptoms are aggravated by bending, twisting and certain positions. The pain is the same all the time. Associated symptoms include leg pain. Pertinent negatives include no chest pain, no fever, no numbness, no headaches, no abdominal pain, no tingling and no weakness. She has tried nothing for the symptoms. Risk factors: she was told she has a herniated disk by MRI during a workup at [facility] 3/12.  She was seen by a neurosurgeon there which she did not like,  so did not return,  but states they told her she needed surgery.  Leg Pain  Pertinent negatives include no numbness and no tingling.    Past Medical History  Diagnosis Date  . COPD (chronic obstructive pulmonary disease)   . Depression   . Back pain     Past Surgical History  Procedure Date  . Cesarean section   . Cholecystectomy   . Abdominal hysterectomy     History reviewed. No pertinent family history.  History  Substance Use Topics  . Smoking status: Current Everyday Smoker    Types: Cigarettes  . Smokeless  tobacco: Not on file  . Alcohol Use: No    OB History    Grav Para Term Preterm Abortions TAB SAB Ect Mult Living                  Review of Systems  Constitutional: Negative for fever.  HENT: Negative for congestion, sore throat and neck pain.   Eyes: Negative.   Respiratory: Negative for chest tightness and shortness of breath.   Cardiovascular: Negative for chest pain.  Gastrointestinal: Negative for nausea and abdominal pain.  Genitourinary: Negative.   Musculoskeletal: Positive for back pain. Negative for myalgias, joint swelling and arthralgias.  Skin: Negative.  Negative for rash and wound.  Neurological: Negative for dizziness, tingling, weakness, light-headedness, numbness and headaches.  Hematological: Negative.   Psychiatric/Behavioral: Negative.     Allergies  Ciprofloxacin; Morphine and related; Toradol; and Ultram  Home Medications   Current Outpatient Rx  Name Route Sig Dispense Refill  . ALBUTEROL SULFATE HFA 108 (90 BASE) MCG/ACT IN AERS Inhalation Inhale 2 puffs into the lungs every 4 (four) hours.      Marland Kitchen CITALOPRAM HYDROBROMIDE 20 MG PO TABS Oral Take 20 mg by mouth daily.      Marland Kitchen FLUTICASONE-SALMETEROL 100-50 MCG/DOSE IN AEPB Inhalation Inhale 1 puff into the lungs every 12 (twelve) hours.      . IBUPROFEN 800 MG PO TABS Oral Take 800 mg by mouth every 8 (eight) hours as needed.  Pain     . CYCLOBENZAPRINE HCL 5 MG PO TABS Oral Take 1 tablet (5 mg total) by mouth 3 (three) times daily as needed for muscle spasms. 15 tablet 0  . OXYCODONE-ACETAMINOPHEN 5-325 MG PO TABS Oral Take 1 tablet by mouth every 4 (four) hours as needed for pain. 20 tablet 0  . PREDNISONE 20 MG PO TABS Oral Take 3 tablets (60 mg total) by mouth daily. 12 tablet 0    BP 127/81  Pulse 87  Temp(Src) 98.7 F (37.1 C) (Oral)  Resp 20  Ht 5\' 8"  (1.727 m)  Wt 239 lb (108.41 kg)  BMI 36.34 kg/m2  SpO2 99%  Physical Exam  Constitutional: She is oriented to person, place, and time.  She appears well-developed and well-nourished.  HENT:  Head: Normocephalic.  Eyes: Conjunctivae are normal.  Neck: Normal range of motion. Neck supple.  Cardiovascular: Regular rhythm and intact distal pulses.        Pedal pulses normal.  Pulmonary/Chest: Effort normal. She has no wheezes.  Abdominal: Soft. Bowel sounds are normal. She exhibits no distension and no mass.  Musculoskeletal: Normal range of motion. She exhibits no edema.       Lumbar back: She exhibits tenderness. She exhibits no swelling, no edema and no spasm.  Neurological: She is alert and oriented to person, place, and time. She has normal strength. She displays no atrophy and no tremor. No cranial nerve deficit or sensory deficit. Gait normal.  Reflex Scores:      Patellar reflexes are 2+ on the right side and 2+ on the left side.      Achilles reflexes are 2+ on the right side and 2+ on the left side.      No strength deficit noted in hip and knee flexor and extensor muscle groups.  Ankle flexion and extension intact.  Skin: Skin is warm and dry.  Psychiatric: She has a normal mood and affect.    ED Course  Procedures (including critical care time)  Labs Reviewed - No data to display No results found.   1. Lumbar radiculopathy       MDM  Acute and chronic lumbar radiculopathy with no neuro deficits on exam.        Candis Musa, PA 07/15/11 1712

## 2011-07-17 NOTE — ED Provider Notes (Signed)
Medical screening examination/treatment/procedure(s) were performed by non-physician practitioner and as supervising physician I was immediately available for consultation/collaboration.  Nicholes Stairs, MD 07/17/11 [phone]

## 2011-11-04 ENCOUNTER — Encounter (HOSPITAL_BASED_OUTPATIENT_CLINIC_OR_DEPARTMENT_OTHER): Payer: Self-pay | Admitting: *Deleted

## 2011-11-04 ENCOUNTER — Emergency Department (HOSPITAL_BASED_OUTPATIENT_CLINIC_OR_DEPARTMENT_OTHER)
Admission: EM | Admit: 2011-11-04 | Discharge: 2011-11-04 | Disposition: A | Discharge status: Home / Self Care | Payer: Self-pay | Attending: Emergency Medicine | Admitting: Emergency Medicine

## 2011-11-04 DIAGNOSIS — J449 Chronic obstructive pulmonary disease, unspecified: Secondary | ICD-10-CM | POA: Insufficient documentation

## 2011-11-04 DIAGNOSIS — L089 Local infection of the skin and subcutaneous tissue, unspecified: Secondary | ICD-10-CM | POA: Insufficient documentation

## 2011-11-04 DIAGNOSIS — F3289 Other specified depressive episodes: Secondary | ICD-10-CM | POA: Insufficient documentation

## 2011-11-04 DIAGNOSIS — F329 Major depressive disorder, single episode, unspecified: Secondary | ICD-10-CM | POA: Insufficient documentation

## 2011-11-04 DIAGNOSIS — J4489 Other specified chronic obstructive pulmonary disease: Secondary | ICD-10-CM | POA: Insufficient documentation

## 2011-11-04 MED ORDER — LIDOCAINE HCL (PF) 1 % IJ SOLN
2.6000 mL | Freq: Once | INTRAMUSCULAR | Status: AC
  Administered 2011-11-04: 2.6 mL via INTRADERMAL

## 2011-11-04 MED ORDER — CEFTRIAXONE SODIUM 1 G IJ SOLR
1.0000 g | Freq: Once | INTRAMUSCULAR | Status: AC
  Administered 2011-11-04: 1 g via INTRAMUSCULAR
  Filled 2011-11-04: qty 10

## 2011-11-04 MED ORDER — LIDOCAINE HCL (PF) 1 % IJ SOLN
INTRAMUSCULAR | Status: AC
  Administered 2011-11-04: 2.6 mL via INTRADERMAL
  Filled 2011-11-04: qty 5

## 2011-11-04 MED ORDER — CEPHALEXIN 500 MG PO CAPS: 500.0000 mg | ORAL_CAPSULE | Freq: Four times a day (QID) | ORAL | Status: AC

## 2011-11-04 MED ORDER — DOXYCYCLINE HYCLATE 100 MG PO CAPS: 100.0000 mg | ORAL_CAPSULE | Freq: Two times a day (BID) | ORAL | Status: AC

## 2011-11-04 MED ORDER — LIDOCAINE HCL 2 % IJ SOLN
INTRAMUSCULAR | Status: AC
  Filled 2011-11-04: qty 1

## 2011-11-04 MED ORDER — HYDROCODONE-ACETAMINOPHEN 5-325 MG PO TABS: 2.0000 | ORAL_TABLET | ORAL | Status: AC | PRN

## 2011-11-04 NOTE — ED Notes (Signed)
Pt requesting pain medication. This pt is here with another pt in the department who has already received narcotics. There was no other person visualized in this ED to act as responsible driver. The patient was given a prescription.

## 2011-11-04 NOTE — ED Provider Notes (Signed)
History     CSN: [id number]  Arrival date & time 11/04/11  1831   First MD Initiated Contact with Patient 11/04/11 1851      Chief Complaint  Patient presents with  . Abscess    (Consider location/radiation/quality/duration/timing/severity/associated sxs/prior treatment) Patient is a 51 y.o. female presenting with abscess. The history is provided by the patient. No language interpreter was used.  Abscess  This is a new problem. The problem occurs continuously. The abscess is present on the abdomen. The problem is moderate. The abscess is characterized by redness and painfulness. The abscess first occurred at home. There were no sick contacts. Recently, medical care has been given by the PCP.  Pt reports she saw her primary MD yesterday but area was not there.  Pt reports area of swelling came up suddenly today  Past Medical History  Diagnosis Date  . COPD (chronic obstructive pulmonary disease)   . Depression   . Back pain     Past Surgical History  Procedure Date  . Cesarean section   . Cholecystectomy   . Abdominal hysterectomy     History reviewed. No pertinent family history.  History  Substance Use Topics  . Smoking status: Current Everyday Smoker    Types: Cigarettes  . Smokeless tobacco: Not on file  . Alcohol Use: No    OB History    Grav Para Term Preterm Abortions TAB SAB Ect Mult Living                  Review of Systems  Skin: Positive for color change.  All other systems reviewed and are negative.    Allergies  Ciprofloxacin; Morphine and related; Toradol; and Ultram  Home Medications   Current Outpatient Rx  Name Route Sig Dispense Refill  . ALBUTEROL SULFATE HFA 108 (90 BASE) MCG/ACT IN AERS Inhalation Inhale 2 puffs into the lungs every 4 (four) hours.      Marland Kitchen CITALOPRAM HYDROBROMIDE 20 MG PO TABS Oral Take 20 mg by mouth daily.      Marland Kitchen CLONAZEPAM 1 MG PO TABS Oral Take 1 mg by mouth 3 (three) times daily.    Marland Kitchen FLUTICASONE-SALMETEROL  100-50 MCG/DOSE IN AEPB Inhalation Inhale 1 puff into the lungs every 12 (twelve) hours.      . IBUPROFEN 800 MG PO TABS Oral Take 800 mg by mouth every 8 (eight) hours as needed. Pain       BP 134/99  Pulse 99  Temp(Src) 98.1 F (36.7 C) (Oral)  Resp 16  Ht 5\' 8"  (1.727 m)  Wt 247 lb (112.038 kg)  BMI 37.56 kg/m2  SpO2 100%  Physical Exam  Nursing note and vitals reviewed. Constitutional: She appears well-developed and well-nourished.  HENT:  Head: Normocephalic.  Eyes: Pupils are equal, round, and reactive to light.  Neck: Normal range of motion.  Pulmonary/Chest: Effort normal.  Abdominal: Soft. There is tenderness.  Musculoskeletal: Normal range of motion.  Neurological: She is alert.  Skin: There is erythema.  Psychiatric: She has a normal mood and affect.    ED Course  Procedures (including critical care time)  Labs Reviewed - No data to display No results found.   No diagnosis found.    MDM  Pt given Rocephin IM.  Pt given rx for doxycycline, keflex and hydrocodone       Langston Masker, [place] 11/04/11 1947

## 2011-11-04 NOTE — ED Provider Notes (Signed)
Medical screening examination/treatment/procedure(s) were performed by non-physician practitioner and as supervising physician I was immediately available for consultation/collaboration.   Caty Tessler A. Patrica Duel, MD 11/04/11 2317

## 2011-11-04 NOTE — ED Notes (Signed)
Pt c/o abscess to right lower abd

## 2012-03-22 ENCOUNTER — Encounter (HOSPITAL_BASED_OUTPATIENT_CLINIC_OR_DEPARTMENT_OTHER): Payer: Self-pay | Admitting: *Deleted

## 2012-03-22 ENCOUNTER — Emergency Department (HOSPITAL_BASED_OUTPATIENT_CLINIC_OR_DEPARTMENT_OTHER)
Admission: EM | Admit: 2012-03-22 | Discharge: 2012-03-22 | Disposition: A | Discharge status: Home / Self Care | Payer: Self-pay | Attending: Emergency Medicine | Admitting: Emergency Medicine

## 2012-03-22 DIAGNOSIS — F17200 Nicotine dependence, unspecified, uncomplicated: Secondary | ICD-10-CM | POA: Insufficient documentation

## 2012-03-22 DIAGNOSIS — J4489 Other specified chronic obstructive pulmonary disease: Secondary | ICD-10-CM | POA: Insufficient documentation

## 2012-03-22 DIAGNOSIS — R1012 Left upper quadrant pain: Secondary | ICD-10-CM | POA: Insufficient documentation

## 2012-03-22 DIAGNOSIS — J449 Chronic obstructive pulmonary disease, unspecified: Secondary | ICD-10-CM | POA: Insufficient documentation

## 2012-03-22 LAB — URINALYSIS, ROUTINE W REFLEX MICROSCOPIC
Bilirubin Urine: NEGATIVE
Glucose, UA: NEGATIVE mg/dL
Ketones, ur: NEGATIVE mg/dL
Protein, ur: NEGATIVE mg/dL
Urobilinogen, UA: 0.2 mg/dL (ref 0.0–1.0)

## 2012-03-22 LAB — COMPREHENSIVE METABOLIC PANEL
ALT: 24 U/L (ref 0–35)
AST: 19 U/L (ref 0–37)
Alkaline Phosphatase: 125 U/L — ABNORMAL HIGH (ref 39–117)
CO2: 27 mEq/L (ref 19–32)
Chloride: 103 mEq/L (ref 96–112)
GFR calc Af Amer: >90 mL/min (ref >90)
GFR calc non Af Amer: >90 mL/min (ref >90)
Glucose, Bld: 99 mg/dL (ref 70–99)
Sodium: 139 mEq/L (ref 135–145)
Total Bilirubin: 0.1 mg/dL — ABNORMAL LOW (ref 0.3–1.2)

## 2012-03-22 LAB — CBC
HCT: 41.2 % (ref 36.0–46.0)
MCV: 85.7 fL (ref 78.0–100.0)
Platelets: 300 10*3/uL (ref 150–400)
RBC: 4.81 MIL/uL (ref 3.87–5.11)
RDW: 12.7 % (ref 11.5–15.5)
WBC: 11.1 10*3/uL — ABNORMAL HIGH (ref 4.0–10.5)

## 2012-03-22 LAB — DIFFERENTIAL
Basophils Absolute: 0 10*3/uL (ref 0.0–0.1)
Eosinophils Relative: 2 % (ref 0–5)
Lymphocytes Relative: 40 % (ref 12–46)
Lymphs Abs: 4.4 10*3/uL — ABNORMAL HIGH (ref 0.7–4.0)
Neutro Abs: 5.7 10*3/uL (ref 1.7–7.7)

## 2012-03-22 LAB — URINE MICROSCOPIC-ADD ON

## 2012-03-22 MED ORDER — HYDROCODONE-ACETAMINOPHEN 5-325 MG PO TABS
2.0000 | ORAL_TABLET | Freq: Once | ORAL | Status: AC
  Administered 2012-03-22: 2 via ORAL
  Filled 2012-03-22: qty 2

## 2012-03-22 MED ORDER — HYDROCODONE-ACETAMINOPHEN 5-325 MG PO TABS: 2.0000 | ORAL_TABLET | Freq: Four times a day (QID) | ORAL | Status: AC | PRN

## 2012-03-22 NOTE — ED Notes (Addendum)
Pain under her left breast x 1 week. Has a knot under the skin since January. She had a CT in January when she found the knot and CT did not show any abnormality. It is getting larger with time.

## 2012-03-22 NOTE — ED Notes (Signed)
Pt sts lobe on left side under breast has increase over 3 weeks with sharp pain localized

## 2012-03-25 NOTE — ED Provider Notes (Signed)
History     CSN: [id number]  Arrival date & time 03/22/12  2015   First MD Initiated Contact with Patient 03/22/12 2130      Chief Complaint  Patient presents with  . Abdominal Pain    (Consider location/radiation/quality/duration/timing/severity/associated sxs/prior treatment) HPI Patient is a 51 year old female who presents today complaining of pain from a mass in her left upper quadrant of the abdomen that she rates as an 8/10 and describes as an aching sensation. This has been present for 6 months. Patient had a CT at initial diagnosis and was told that there is no abnormality. Patient notes that this area has been getting larger. She has followup with a surgeon for this. Patient denies any injury to the area. The pain appears to be located immediately around the mass in question. 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 There is no overlying skin change, induration, or fluctuance. Patient has no underlying abdominal pain, nausea, vomiting, constipation, diarrhea, urinary symptoms, fevers, or vaginal discharge. She denies recent weight changes or other symptoms concerning for malignancy. Patient can think of nothing that makes the pain better or worse. There are no other associated or modifying factors. Past Medical History  Diagnosis Date  . COPD (chronic obstructive pulmonary disease)   . Depression   . Back pain     Past Surgical History  Procedure Date  . Cesarean section   . Cholecystectomy   . Abdominal hysterectomy     No family history on file.  History  Substance Use Topics  . Smoking status: Current Everyday Smoker    Types: Cigarettes  . Smokeless tobacco: Not on file  . Alcohol Use: No    OB History    Grav Para Term Preterm Abortions TAB SAB Ect Mult Living                  Review of Systems  Constitutional: Negative.   HENT: Negative.   Eyes: Negative.   Respiratory: Negative.   Cardiovascular: Negative.     Gastrointestinal: Positive for abdominal pain.       Soft tissue mass for the past 6 months  Genitourinary: Negative.   Musculoskeletal: Negative.   Skin: Negative.   Neurological: Negative.   Hematological: Negative.   Psychiatric/Behavioral: Negative.   All other systems reviewed and are negative.    Allergies  Ciprofloxacin; Ketorolac tromethamine; Morphine and related; and Ultram  Home Medications   Current Outpatient Rx  Name Route Sig Dispense Refill  . ALBUTEROL SULFATE HFA 108 (90 BASE) MCG/ACT IN AERS Inhalation Inhale 2 puffs into the lungs every 4 (four) hours. Patient uses this medication for shortness of breath.    Marland Kitchen CITALOPRAM HYDROBROMIDE 20 MG PO TABS Oral Take 20 mg by mouth daily.     Marland Kitchen CLONAZEPAM 1 MG PO TABS Oral Take 1 mg by mouth 3 (three) times daily.    Marland Kitchen FLUTICASONE-SALMETEROL 100-50 MCG/DOSE IN AEPB Inhalation Inhale 1 puff into the lungs every 12 (twelve) hours. Patient is to use this medication for shortness of breath.    . IBUPROFEN 800 MG PO TABS Oral Take 800 mg by mouth every 8 (eight) hours as needed. Pain    . PRESCRIPTION MEDICATION  Patient uses oxygen.  She uses 02.  2 liters.    Marland Kitchen HYDROCODONE-ACETAMINOPHEN 5-325 MG PO TABS Oral Take 2 tablets by mouth every 6 (six) hours as needed for pain. 30 tablet 0    BP  134/89  Pulse 92  Temp 98.5 F (36.9 C) (Oral)  Resp 20  SpO2 96%  Physical Exam  Nursing note and vitals reviewed. GEN: Well-developed, well-nourished female in no distress HEENT: Atraumatic, normocephalic.  EYES: PERRLA BL, no scleral icterus. NECK: Trachea midline, no meningismus CV: regular rate and rhythm. No murmurs, rubs, or gallops PULM: No respiratory distress.  No crackles, wheezes, or rales. GI: soft, patient with 2" x 3" soft, rubbery, and mobile mass noted overlying the left upper quadrant. This mass is minimally tender to palpation. No guarding, rebound, or tenderness. + bowel sounds  GU: deferred Neuro: cranial  nerves grossly 2-12 intact, no abnormalities of strength or sensation, A and O x 3 MSK: Patient moves all 4 extremities symmetrically, no deformity, edema, or injury noted Skin: No rashes petechiae, purpura, or jaundice Psych: no abnormality of mood   ED Course  Procedures (including critical care time)  Labs Reviewed  CBC - Abnormal; Notable for the following:    WBC 11.1 (*)     All other components within normal limits  DIFFERENTIAL - Abnormal; Notable for the following:    Lymphs Abs 4.4 (*)     All other components within normal limits  COMPREHENSIVE METABOLIC PANEL - Abnormal; Notable for the following:    Alkaline Phosphatase 125 (*)     Total Bilirubin 0.1 (*)     All other components within normal limits  URINALYSIS, ROUTINE W REFLEX MICROSCOPIC - Abnormal; Notable for the following:    Hgb urine dipstick TRACE (*)     Leukocytes, UA SMALL (*)     All other components within normal limits  LIPASE, BLOOD  URINE MICROSCOPIC-ADD ON   No results found.   1. Abdominal wall pain in left upper quadrant       MDM  Patient was evaluated by myself. She did have laboratory workup performed for her abdominal pain. Patient had no leukocytosis, anemia, renal compromise, which went abnormalities, evidence of urinary tract infection, or other concerning findings. She was completely hemodynamically stable and appeared to have some discomfort associated with a soft tissue mass that she's had for the past 6 months. Patient has hard he had CAT scan of this and has plans to followup with a surgeon regarding it. She was given 2 tabs of Vicodin and a prescription for 15 tabs of this. She was told to followup as previously scheduled with surgery. Patient was discharged in good condition. Had no concern for intra-abdominal process, cellulitis, abscess, or other life-threatening condition today.        Cyndra Numbers, MD 03/25/12 [phone]

## 2012-04-14 ENCOUNTER — Emergency Department (HOSPITAL_BASED_OUTPATIENT_CLINIC_OR_DEPARTMENT_OTHER)
Admission: EM | Admit: 2012-04-14 | Discharge: 2012-04-15 | Disposition: A | Discharge status: Home / Self Care | Payer: Self-pay | Attending: Emergency Medicine | Admitting: Emergency Medicine

## 2012-04-14 ENCOUNTER — Encounter (HOSPITAL_BASED_OUTPATIENT_CLINIC_OR_DEPARTMENT_OTHER): Payer: Self-pay | Admitting: *Deleted

## 2012-04-14 DIAGNOSIS — M25529 Pain in unspecified elbow: Secondary | ICD-10-CM | POA: Insufficient documentation

## 2012-04-14 DIAGNOSIS — F329 Major depressive disorder, single episode, unspecified: Secondary | ICD-10-CM | POA: Insufficient documentation

## 2012-04-14 DIAGNOSIS — F3289 Other specified depressive episodes: Secondary | ICD-10-CM | POA: Insufficient documentation

## 2012-04-14 DIAGNOSIS — Z79899 Other long term (current) drug therapy: Secondary | ICD-10-CM | POA: Insufficient documentation

## 2012-04-14 DIAGNOSIS — J4489 Other specified chronic obstructive pulmonary disease: Secondary | ICD-10-CM | POA: Insufficient documentation

## 2012-04-14 DIAGNOSIS — W19XXXA Unspecified fall, initial encounter: Secondary | ICD-10-CM | POA: Insufficient documentation

## 2012-04-14 DIAGNOSIS — J449 Chronic obstructive pulmonary disease, unspecified: Secondary | ICD-10-CM | POA: Insufficient documentation

## 2012-04-14 DIAGNOSIS — M79609 Pain in unspecified limb: Secondary | ICD-10-CM | POA: Insufficient documentation

## 2012-04-14 DIAGNOSIS — M25579 Pain in unspecified ankle and joints of unspecified foot: Secondary | ICD-10-CM | POA: Insufficient documentation

## 2012-04-14 DIAGNOSIS — S5010XA Contusion of unspecified forearm, initial encounter: Secondary | ICD-10-CM | POA: Insufficient documentation

## 2012-04-14 NOTE — ED Notes (Signed)
Pt fell 45 min PTA and presents with left wrist pain and left head pain pt with recent falls states that her left leg keeps giving out

## 2012-04-15 ENCOUNTER — Emergency Department (HOSPITAL_BASED_OUTPATIENT_CLINIC_OR_DEPARTMENT_OTHER): Payer: Self-pay

## 2012-04-15 NOTE — ED Provider Notes (Signed)
History     CSN: [id number]  Arrival date & time 04/14/12  2337   First MD Initiated Contact with Patient 04/15/12 0014      Chief Complaint  Patient presents with  . Fall    (Consider location/radiation/quality/duration/timing/severity/associated sxs/prior treatment) HPI  Patient complaining of fall due to pain lle this pm.  States struck head but no loc, some headache.  Pain in left forearm and elbow and left ankle from fall.  Patietn ambulatory after fall.  Pain at 5/10, no tratment pta.   Past Medical History  Diagnosis Date  . COPD (chronic obstructive pulmonary disease)   . Depression   . Back pain     Past Surgical History  Procedure Date  . Cesarean section   . Cholecystectomy   . Abdominal hysterectomy     History reviewed. No pertinent family history.  History  Substance Use Topics  . Smoking status: Current Everyday Smoker    Types: Cigarettes  . Smokeless tobacco: Not on file  . Alcohol Use: No    OB History    Grav Para Term Preterm Abortions TAB SAB Ect Mult Living                  Review of Systems  All other systems reviewed and are negative.    Allergies  Ciprofloxacin; Ketorolac tromethamine; Morphine and related; and Ultram  Home Medications   Current Outpatient Rx  Name Route Sig Dispense Refill  . ALBUTEROL SULFATE HFA 108 (90 BASE) MCG/ACT IN AERS Inhalation Inhale 2 puffs into the lungs every 4 (four) hours. Patient uses this medication for shortness of breath.    Marland Kitchen CITALOPRAM HYDROBROMIDE 20 MG PO TABS Oral Take 20 mg by mouth daily.     Marland Kitchen CLONAZEPAM 1 MG PO TABS Oral Take 1 mg by mouth 3 (three) times daily.    Marland Kitchen FLUTICASONE-SALMETEROL 100-50 MCG/DOSE IN AEPB Inhalation Inhale 1 puff into the lungs every 12 (twelve) hours. Patient is to use this medication for shortness of breath.    . IBUPROFEN 800 MG PO TABS Oral Take 800 mg by mouth every 8 (eight) hours as needed. Pain    . PRESCRIPTION MEDICATION  Patient uses oxygen.  She  uses 02.  2 liters.      BP 123/85  Pulse 93  Temp 98 F (36.7 C) (Oral)  Resp 18  SpO2 98%  Physical Exam  Nursing note and vitals reviewed. Constitutional: She appears well-developed and well-nourished.  HENT:  Head: Normocephalic and atraumatic.  Eyes: Conjunctivae and EOM are normal. Pupils are equal, round, and reactive to light.  Neck: Normal range of motion. Neck supple.  Cardiovascular: Normal rate, regular rhythm, normal heart sounds and intact distal pulses.   Pulmonary/Chest: Effort normal and breath sounds normal.  Abdominal: Soft. Bowel sounds are normal.  Musculoskeletal: Normal range of motion.       Mild tenderness left elbow, forearm and left ankle.  nvt distal to all injuries.   Neurological: She is alert.  Skin: Skin is warm and dry.  Psychiatric: She has a normal mood and affect. Thought content normal.    ED Course  Procedures (including critical care time)  Labs Reviewed - No data to display Dg Elbow Complete Left  04/15/2012  *RADIOLOGY REPORT*  Clinical Data: Fall, elbow pain.  LEFT ELBOW - COMPLETE 3+ VIEW  Comparison: None.  Findings: No acute bony abnormality.  Specifically, no fracture, subluxation, or dislocation.  Soft tissues are intact.  No  joint effusion.  Well corticated bone density adjacent to the lateral humeral condyle, likely related to old injury or secondary ossification center.  IMPRESSION: No acute bony abnormality.  Original Report Authenticated By: Cyndie Chime, M.D.   Dg Wrist Complete Left  04/15/2012  *RADIOLOGY REPORT*  Clinical Data: Fall, left wrist injury.  LEFT WRIST - COMPLETE 3+ VIEW  Comparison: None.  Findings: Degenerative changes of the first carpal metacarpal joint. No acute bony abnormality.  Specifically, no fracture, subluxation, or dislocation.  Soft tissues are intact.  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 No acute bony abnormality.  Original Report Authenticated By: Cyndie Chime,  M.D.   Dg Ankle Complete Left  04/15/2012  *RADIOLOGY REPORT*  Clinical Data: Fall.  LEFT ANKLE COMPLETE - 3+ VIEW  Comparison: None.  Findings: No acute bony abnormality.  Specifically, no fracture, subluxation, or dislocation.  Soft tissues are intact.  IMPRESSION: No bony abnormality.  Original Report Authenticated By: Cyndie Chime, M.D.   Ct Head Wo Contrast  04/15/2012  *RADIOLOGY REPORT*  Clinical Data: Headache post head trauma.  CT HEAD WITHOUT CONTRAST  Technique:  Contiguous axial images were obtained from the base of the skull through the vertex without contrast.  Comparison: None.  Findings: There is no evidence for acute hemorrhage, hydrocephalus, mass lesion, or abnormal extra-axial fluid collection.  No definite CT evidence for acute infarction.  The visualized paranasal sinuses and mastoid air cells are predominately clear.  No displaced calvarial fracture.  IMPRESSION: No acute intracranial abnormality.  Original Report Authenticated By: Waneta Martins, M.D.     1. Fall   2. Contusion, forearm       MDM          Hilario Quarry, MD 04/15/12 [phone]

## 2012-05-13 ENCOUNTER — Emergency Department (HOSPITAL_BASED_OUTPATIENT_CLINIC_OR_DEPARTMENT_OTHER)
Admission: EM | Admit: 2012-05-13 | Discharge: 2012-05-13 | Disposition: A | Discharge status: Home / Self Care | Payer: Self-pay | Attending: Emergency Medicine | Admitting: Emergency Medicine

## 2012-05-13 ENCOUNTER — Encounter (HOSPITAL_BASED_OUTPATIENT_CLINIC_OR_DEPARTMENT_OTHER): Payer: Self-pay

## 2012-05-13 DIAGNOSIS — J449 Chronic obstructive pulmonary disease, unspecified: Secondary | ICD-10-CM | POA: Insufficient documentation

## 2012-05-13 DIAGNOSIS — J4489 Other specified chronic obstructive pulmonary disease: Secondary | ICD-10-CM | POA: Insufficient documentation

## 2012-05-13 DIAGNOSIS — F3289 Other specified depressive episodes: Secondary | ICD-10-CM | POA: Insufficient documentation

## 2012-05-13 DIAGNOSIS — H60399 Other infective otitis externa, unspecified ear: Secondary | ICD-10-CM | POA: Insufficient documentation

## 2012-05-13 DIAGNOSIS — H6090 Unspecified otitis externa, unspecified ear: Secondary | ICD-10-CM

## 2012-05-13 DIAGNOSIS — F329 Major depressive disorder, single episode, unspecified: Secondary | ICD-10-CM | POA: Insufficient documentation

## 2012-05-13 DIAGNOSIS — F17200 Nicotine dependence, unspecified, uncomplicated: Secondary | ICD-10-CM | POA: Insufficient documentation

## 2012-05-13 MED ORDER — NEOMYCIN-POLYMYXIN-HC 3.5-10000-1 OT SUSP: 4.0000 [drp] | Freq: Three times a day (TID) | OTIC | Status: AC

## 2012-05-13 MED ORDER — CEPHALEXIN 500 MG PO CAPS: 500.0000 mg | ORAL_CAPSULE | Freq: Four times a day (QID) | ORAL | Status: AC

## 2012-05-13 MED ORDER — HYDROCODONE-ACETAMINOPHEN 5-325 MG PO TABS: 2.0000 | ORAL_TABLET | ORAL | Status: AC | PRN

## 2012-05-13 NOTE — ED Notes (Signed)
Pt reports right ear pain, drainage and decreased hearing.

## 2012-05-13 NOTE — ED Provider Notes (Deleted)
Medical screening examination/treatment/procedure(s) were performed by non-physician practitioner and as supervising physician I was immediately available for consultation/collaboration.   Shannon Stevens. Filmore Molyneux, MD 05/13/12 1308

## 2012-05-13 NOTE — ED Provider Notes (Addendum)
History     CSN: [id number]  Arrival date & time 05/13/12  1540   First MD Initiated Contact with Patient 05/13/12 1641      Chief Complaint  Patient presents with  . Otalgia    (Consider location/radiation/quality/duration/timing/severity/associated sxs/prior treatment) Patient is a 51 y.o. female presenting with ear pain. The history is provided by the patient. No language interpreter was used.  Otalgia This is a new problem. The current episode started more than 2 days ago. There is pain in the right ear. The problem has been gradually worsening. There has been no fever. The pain is moderate. Associated symptoms include ear discharge.    Past Medical History  Diagnosis Date  . COPD (chronic obstructive pulmonary disease)   . Depression   . Back pain     Past Surgical History  Procedure Date  . Cesarean section   . Cholecystectomy   . Abdominal hysterectomy     No family history on file.  History  Substance Use Topics  . Smoking status: Current Everyday Smoker    Types: Cigarettes  . Smokeless tobacco: Not on file  . Alcohol Use: No    OB History    Grav Para Term Preterm Abortions TAB SAB Ect Mult Living                  Review of Systems  HENT: Positive for ear pain and ear discharge.   All other systems reviewed and are negative.    Allergies  Ciprofloxacin; Ketorolac tromethamine; Morphine and related; and Ultram  Home Medications   Current Outpatient Rx  Name Route Sig Dispense Refill  . ALBUTEROL SULFATE HFA 108 (90 BASE) MCG/ACT IN AERS Inhalation Inhale 2 puffs into the lungs every 4 (four) hours. Patient uses this medication for shortness of breath.    . CLONAZEPAM 1 MG PO TABS Oral Take 1 mg by mouth 3 (three) times daily.    . IBUPROFEN 800 MG PO TABS Oral Take 800 mg by mouth every 8 (eight) hours as needed. Pain    . PRESCRIPTION MEDICATION  Patient uses oxygen.  She uses 02.  2 liters.    Marland Kitchen CITALOPRAM HYDROBROMIDE 20 MG PO TABS Oral  Take 20 mg by mouth daily.     Marland Kitchen FLUTICASONE-SALMETEROL 100-50 MCG/DOSE IN AEPB Inhalation Inhale 1 puff into the lungs every 12 (twelve) hours. Patient is to use this medication for shortness of breath.      BP 158/94  Pulse 103  Temp 98.3 F (36.8 C) (Oral)  Resp 18  Ht 5\' 8"  (1.727 m)  Wt 250 lb (113.399 kg)  BMI 38.01 kg/m2  SpO2 98%  Physical Exam  Nursing note and vitals reviewed. Constitutional: She is oriented to person, place, and time. She appears well-developed and well-nourished.  HENT:  Head: Normocephalic and atraumatic.  Left Ear: External ear normal.  Mouth/Throat: Oropharynx is clear and moist.       Right ear canal swollen yellow drainage  Eyes: Conjunctivae and EOM are normal. Pupils are equal, round, and reactive to light.  Neck: Normal range of motion. Neck supple.  Cardiovascular: Normal rate.   Pulmonary/Chest: Effort normal.  Abdominal: Soft.  Musculoskeletal: Normal range of motion.  Neurological: She is alert and oriented to person, place, and time. She has normal reflexes.    ED Course  Procedures (including critical care time)  Labs Reviewed - No data to display No results found.   1. Otitis external  MDM  Cortisporin otic, keflex and hydrocodone        Lonia Skinner [place], [place] 05/13/12 1706  Lonia Skinner [place], [place] 05/13/12 [phone]

## 2012-05-13 NOTE — ED Provider Notes (Signed)
Medical screening examination/treatment/procedure(s) were performed by non-physician practitioner and as supervising physician I was immediately available for consultation/collaboration.   Gavin Pound. Nechemia Chiappetta, MD 05/13/12 1710

## 2012-06-25 ENCOUNTER — Encounter (HOSPITAL_BASED_OUTPATIENT_CLINIC_OR_DEPARTMENT_OTHER): Payer: Self-pay | Admitting: *Deleted

## 2012-06-25 ENCOUNTER — Emergency Department (HOSPITAL_BASED_OUTPATIENT_CLINIC_OR_DEPARTMENT_OTHER)
Admission: EM | Admit: 2012-06-25 | Discharge: 2012-06-25 | Disposition: A | Discharge status: Home / Self Care | Payer: Self-pay | Attending: Emergency Medicine | Admitting: Emergency Medicine

## 2012-06-25 DIAGNOSIS — J449 Chronic obstructive pulmonary disease, unspecified: Secondary | ICD-10-CM | POA: Insufficient documentation

## 2012-06-25 DIAGNOSIS — H6092 Unspecified otitis externa, left ear: Secondary | ICD-10-CM

## 2012-06-25 DIAGNOSIS — H9209 Otalgia, unspecified ear: Secondary | ICD-10-CM | POA: Insufficient documentation

## 2012-06-25 DIAGNOSIS — F3289 Other specified depressive episodes: Secondary | ICD-10-CM | POA: Insufficient documentation

## 2012-06-25 DIAGNOSIS — J4489 Other specified chronic obstructive pulmonary disease: Secondary | ICD-10-CM | POA: Insufficient documentation

## 2012-06-25 DIAGNOSIS — F17200 Nicotine dependence, unspecified, uncomplicated: Secondary | ICD-10-CM | POA: Insufficient documentation

## 2012-06-25 DIAGNOSIS — F329 Major depressive disorder, single episode, unspecified: Secondary | ICD-10-CM | POA: Insufficient documentation

## 2012-06-25 MED ORDER — SULFAMETHOXAZOLE-TRIMETHOPRIM 800-160 MG PO TABS: 1.0000 | ORAL_TABLET | Freq: Two times a day (BID) | ORAL | Status: AC

## 2012-06-25 MED ORDER — HYDROCODONE-ACETAMINOPHEN 5-325 MG PO TABS: 2.0000 | ORAL_TABLET | ORAL | Status: AC | PRN

## 2012-06-25 NOTE — ED Notes (Signed)
Pt given rx x 2 for hydrocodone and bactrim

## 2012-06-25 NOTE — ED Notes (Signed)
Pt states she was seen here in August for the same thing in her right ear. Has been using drops "but they just come back out". Muffled hearing.

## 2012-06-25 NOTE — ED Provider Notes (Signed)
History     CSN: [id number]  Arrival date & time 06/25/12  0865   First MD Initiated Contact with Patient 06/25/12 1954      Chief Complaint  Patient presents with  . Otalgia    (Consider location/radiation/quality/duration/timing/severity/associated sxs/prior treatment) Patient is a 51 y.o. female presenting with ear pain. The history is provided by the patient. No language interpreter was used.  Otalgia This is a new problem. The current episode started yesterday. There is pain in the left ear. The problem occurs constantly. The problem has been gradually worsening. There has been no fever. The pain is at a severity of 7/10. The pain is moderate. Her past medical history is significant for chronic ear infection.  Pt complains of right ear pain.   Pt using ear drops from previous infection  Past Medical History  Diagnosis Date  . COPD (chronic obstructive pulmonary disease)   . Depression   . Back pain     Past Surgical History  Procedure Date  . Cesarean section   . Cholecystectomy   . Abdominal hysterectomy     History reviewed. No pertinent family history.  History  Substance Use Topics  . Smoking status: Current Every Day Smoker    Types: Cigarettes  . Smokeless tobacco: Not on file  . Alcohol Use: No    OB History    Grav Para Term Preterm Abortions TAB SAB Ect Mult Living                  Review of Systems  HENT: Positive for ear pain.     Allergies  Ciprofloxacin; Ketorolac tromethamine; Morphine and related; and Ultram  Home Medications   Current Outpatient Rx  Name Route Sig Dispense Refill  . ALBUTEROL SULFATE HFA 108 (90 BASE) MCG/ACT IN AERS Inhalation Inhale 2 puffs into the lungs every 4 (four) hours. Patient uses this medication for shortness of breath.    Marland Kitchen CITALOPRAM HYDROBROMIDE 20 MG PO TABS Oral Take 20 mg by mouth daily.     Marland Kitchen CLONAZEPAM 1 MG PO TABS Oral Take 1 mg by mouth 3 (three) times daily.    Marland Kitchen FLUTICASONE-SALMETEROL 100-50  MCG/DOSE IN AEPB Inhalation Inhale 1 puff into the lungs every 12 (twelve) hours. Patient is to use this medication for shortness of breath.    . IBUPROFEN 800 MG PO TABS Oral Take 800 mg by mouth every 8 (eight) hours as needed. Pain    . NEOMYCIN-POLYMYXIN-HC 3.5-10000-1 OT SUSP Left Ear Place 4 drops into the left ear 4 (four) times daily.    Marland Kitchen PRESCRIPTION MEDICATION  Patient uses oxygen.  She uses 02.  2 liters.      BP 140/89  Pulse 114  Temp 97.9 F (36.6 C) (Oral)  Resp 20  Ht 5' 7.5" (1.715 m)  Wt 250 lb (113.399 kg)  BMI 38.58 kg/m2  SpO2 97%  Physical Exam  Nursing note and vitals reviewed. Constitutional: She appears well-developed and well-nourished.  HENT:  Right Ear: External ear normal.       Left ear canal swollen,  Erythematous ear canal  Eyes: Conjunctivae normal and EOM are normal. Pupils are equal, round, and reactive to light.  Neck: Normal range of motion.  Cardiovascular: Normal rate and normal heart sounds.   Pulmonary/Chest: Effort normal.  Abdominal: Soft.  Musculoskeletal: Normal range of motion.  Neurological: She is alert.  Skin: Skin is warm.    ED Course  Procedures (including critical care time)  Labs  Reviewed - No data to display No results found.   No diagnosis found.    MDM  Rx for bactrim and hydrocodone for pain.   I advised pt to continue ear drops        Lonia Skinner [place], [place] 06/25/12 2008

## 2012-06-26 NOTE — ED Provider Notes (Signed)
Medical screening examination/treatment/procedure(s) were performed by non-physician practitioner and as supervising physician I was immediately available for consultation/collaboration.  Emilija Bohman, MD 06/26/12 0041 

## 2012-07-17 ENCOUNTER — Emergency Department (HOSPITAL_BASED_OUTPATIENT_CLINIC_OR_DEPARTMENT_OTHER)
Admission: EM | Admit: 2012-07-17 | Discharge: 2012-07-17 | Disposition: A | Discharge status: Home / Self Care | Payer: Self-pay | Attending: Emergency Medicine | Admitting: Emergency Medicine

## 2012-07-17 ENCOUNTER — Emergency Department (HOSPITAL_BASED_OUTPATIENT_CLINIC_OR_DEPARTMENT_OTHER): Payer: Self-pay

## 2012-07-17 ENCOUNTER — Encounter (HOSPITAL_BASED_OUTPATIENT_CLINIC_OR_DEPARTMENT_OTHER): Payer: Self-pay | Admitting: *Deleted

## 2012-07-17 DIAGNOSIS — J449 Chronic obstructive pulmonary disease, unspecified: Secondary | ICD-10-CM | POA: Insufficient documentation

## 2012-07-17 DIAGNOSIS — M545 Low back pain, unspecified: Secondary | ICD-10-CM | POA: Insufficient documentation

## 2012-07-17 DIAGNOSIS — M549 Dorsalgia, unspecified: Secondary | ICD-10-CM

## 2012-07-17 DIAGNOSIS — J4489 Other specified chronic obstructive pulmonary disease: Secondary | ICD-10-CM | POA: Insufficient documentation

## 2012-07-17 DIAGNOSIS — F17200 Nicotine dependence, unspecified, uncomplicated: Secondary | ICD-10-CM | POA: Insufficient documentation

## 2012-07-17 MED ORDER — OXYCODONE-ACETAMINOPHEN 5-325 MG PO TABS
2.0000 | ORAL_TABLET | Freq: Once | ORAL | Status: AC
  Administered 2012-07-17: 2 via ORAL
  Filled 2012-07-17 (×2): qty 2

## 2012-07-17 MED ORDER — HYDROCODONE-ACETAMINOPHEN 5-325 MG PO TABS: 2.0000 | ORAL_TABLET | ORAL | Status: AC | PRN

## 2012-07-17 NOTE — ED Notes (Signed)
Pt presents to ED today with back pain that radiates down to foot.  Pt states injury over 1 week ago after fall.  Pt has tried to OTC tylenol and motrin with last dose around noon with no change in sx.

## 2012-07-17 NOTE — ED Provider Notes (Signed)
History     CSN: [id number]  Arrival date & time 07/17/12  1753   First MD Initiated Contact with Patient 07/17/12 1942      Chief Complaint  Patient presents with  . Back Pain    (Consider location/radiation/quality/duration/timing/severity/associated sxs/prior treatment) Patient is a 51 y.o. female presenting with back pain. The history is provided by the patient. No language interpreter was used.  Back Pain  This is a new problem. The current episode started more than 1 week ago. The problem occurs constantly. The problem has been gradually worsening. The pain is associated with falling. The pain is present in the lumbar spine. The pain is at a severity of 7/10. The pain is moderate. The symptoms are aggravated by certain positions. The pain is worse during the night. Stiffness is present all day. She has tried nothing for the symptoms.  Pt complains of   Past Medical History  Diagnosis Date  . COPD (chronic obstructive pulmonary disease)   . Depression   . Back pain     Past Surgical History  Procedure Date  . Cesarean section   . Cholecystectomy   . Abdominal hysterectomy     History reviewed. No pertinent family history.  History  Substance Use Topics  . Smoking status: Current Every Day Smoker    Types: Cigarettes  . Smokeless tobacco: Not on file  . Alcohol Use: No    OB History    Grav Para Term Preterm Abortions TAB SAB Ect Mult Living                  Review of Systems  Musculoskeletal: Positive for back pain.  All other systems reviewed and are negative.    Allergies  Ciprofloxacin; Ketorolac tromethamine; Morphine and related; and Ultram  Home Medications   Current Outpatient Rx  Name Route Sig Dispense Refill  . TIOTROPIUM BROMIDE MONOHYDRATE 18 MCG IN CAPS Inhalation Place 18 mcg into inhaler and inhale daily.    . ALBUTEROL SULFATE HFA 108 (90 BASE) MCG/ACT IN AERS Inhalation Inhale 2 puffs into the lungs every 4 (four) hours. Patient  uses this medication for shortness of breath.    Marland Kitchen CITALOPRAM HYDROBROMIDE 20 MG PO TABS Oral Take 20 mg by mouth daily.     Marland Kitchen CLONAZEPAM 1 MG PO TABS Oral Take 1 mg by mouth 3 (three) times daily.    Marland Kitchen FLUTICASONE-SALMETEROL 100-50 MCG/DOSE IN AEPB Inhalation Inhale 1 puff into the lungs every 12 (twelve) hours. Patient is to use this medication for shortness of breath.    . IBUPROFEN 800 MG PO TABS Oral Take 800 mg by mouth every 8 (eight) hours as needed. Pain    . NEOMYCIN-POLYMYXIN-HC 3.5-10000-1 OT SUSP Left Ear Place 4 drops into the left ear 4 (four) times daily.    Marland Kitchen PRESCRIPTION MEDICATION  Patient uses oxygen.  She uses 02.  2 liters.      BP 144/78  Pulse 110  Temp 98.6 F (37 C) (Oral)  Resp 20  Ht 5\' 8"  (1.727 m)  Wt 249 lb (112.946 kg)  BMI 37.86 kg/m2  SpO2 95%  Physical Exam  Nursing note and vitals reviewed. Constitutional: She appears well-developed and well-nourished.  HENT:  Head: Normocephalic and atraumatic.  Eyes: Pupils are equal, round, and reactive to light.  Neck: Normal range of motion.  Cardiovascular: Normal rate.   Pulmonary/Chest: Effort normal.  Abdominal: Soft.  Musculoskeletal:       Tender ls spine,  Decreased  range of motion,  Good reflexes  Neurological: She is alert.  Skin: Skin is warm.  Psychiatric: She has a normal mood and affect.    ED Course  Procedures (including critical care time)  Labs Reviewed - No data to display Dg Lumbar Spine Complete  07/17/2012  *RADIOLOGY REPORT*  Clinical Data: Fall, back pain.  LUMBAR SPINE - COMPLETE 4+ VIEW  Comparison: None.  Findings: There are five lumbar-type vertebral bodies.  No fracture or malalignment.  Disc spaces well maintained.  SI joints are symmetric.  Mild degenerative spurring and facet disease in the lower lumbar spine.  IMPRESSION: No acute findings.   Original Report Authenticated By: Cyndie Chime, M.D.      No diagnosis found.    MDM  Pt given 2 percocet here.   Pt  given rx for hydrocodone.   Pt does not currently have a medical doctor        Elson Areas, [place] 07/17/12 2220

## 2012-07-17 NOTE — ED Provider Notes (Signed)
Medical screening examination/treatment/procedure(s) were performed by non-physician practitioner and as supervising physician I was immediately available for consultation/collaboration.   Bluford Sedler, MD 07/17/12 2329 

## 2012-09-11 ENCOUNTER — Emergency Department (HOSPITAL_COMMUNITY)
Admission: EM | Admit: 2012-09-11 | Discharge: 2012-09-11 | Disposition: A | Discharge status: Home / Self Care | Payer: Self-pay | Attending: Emergency Medicine | Admitting: Emergency Medicine

## 2012-09-11 ENCOUNTER — Encounter (HOSPITAL_COMMUNITY): Payer: Self-pay | Admitting: *Deleted

## 2012-09-11 DIAGNOSIS — F3289 Other specified depressive episodes: Secondary | ICD-10-CM | POA: Insufficient documentation

## 2012-09-11 DIAGNOSIS — W19XXXA Unspecified fall, initial encounter: Secondary | ICD-10-CM | POA: Insufficient documentation

## 2012-09-11 DIAGNOSIS — M545 Low back pain, unspecified: Secondary | ICD-10-CM | POA: Insufficient documentation

## 2012-09-11 DIAGNOSIS — Z79899 Other long term (current) drug therapy: Secondary | ICD-10-CM | POA: Insufficient documentation

## 2012-09-11 DIAGNOSIS — F17200 Nicotine dependence, unspecified, uncomplicated: Secondary | ICD-10-CM | POA: Insufficient documentation

## 2012-09-11 DIAGNOSIS — Y939 Activity, unspecified: Secondary | ICD-10-CM | POA: Insufficient documentation

## 2012-09-11 DIAGNOSIS — M5416 Radiculopathy, lumbar region: Secondary | ICD-10-CM

## 2012-09-11 DIAGNOSIS — J449 Chronic obstructive pulmonary disease, unspecified: Secondary | ICD-10-CM | POA: Insufficient documentation

## 2012-09-11 DIAGNOSIS — F329 Major depressive disorder, single episode, unspecified: Secondary | ICD-10-CM | POA: Insufficient documentation

## 2012-09-11 DIAGNOSIS — S8990XA Unspecified injury of unspecified lower leg, initial encounter: Secondary | ICD-10-CM | POA: Insufficient documentation

## 2012-09-11 DIAGNOSIS — Y929 Unspecified place or not applicable: Secondary | ICD-10-CM | POA: Insufficient documentation

## 2012-09-11 DIAGNOSIS — IMO0002 Reserved for concepts with insufficient information to code with codable children: Secondary | ICD-10-CM | POA: Insufficient documentation

## 2012-09-11 DIAGNOSIS — J4489 Other specified chronic obstructive pulmonary disease: Secondary | ICD-10-CM | POA: Insufficient documentation

## 2012-09-11 DIAGNOSIS — G8929 Other chronic pain: Secondary | ICD-10-CM | POA: Insufficient documentation

## 2012-09-11 MED ORDER — OXYCODONE-ACETAMINOPHEN 5-325 MG PO TABS: 1.0000 | ORAL_TABLET | Freq: Four times a day (QID) | ORAL | Status: DC | PRN

## 2012-09-11 MED ORDER — CYCLOBENZAPRINE HCL 5 MG PO TABS: 5.0000 mg | ORAL_TABLET | Freq: Three times a day (TID) | ORAL | Status: DC | PRN

## 2012-09-11 NOTE — ED Provider Notes (Signed)
History   This chart was scribed for Ethelda Chick, MD by Melba Coon, ED Scribe. The patient was seen in room TR05C/TR05C and the patient's care was started at 7:25PM.    CSN: [id number]  Arrival date & time 09/11/12  1719   None     Chief Complaint  Patient presents with  . Back Pain  . Leg Pain    (Consider location/radiation/quality/duration/timing/severity/associated sxs/prior treatment) Patient is a 51 y.o. female presenting with back pain and leg pain. The history is provided by the patient. No language interpreter was used.  Back Pain  This is a new problem. Episode onset: 2 weeks ago. The problem has been gradually worsening. The pain is associated with falling. The pain is moderate. The symptoms are aggravated by bending, twisting and certain positions. Associated symptoms include leg pain.  Leg Pain  Incident onset: left leg - couple of months; right leg - last couple of weeks. The injury mechanism was a fall. The pain is present in the left leg and right leg (more in left leg). The pain is moderate.  She reports that her left leg became weak and she fell onto her lower back. She reports that she has chronic left leg pain and weakness. She reports that her right leg has started to hurt and get progressively worse after her fall. She reports she has tried to get an appointment with a PCP in [facility], but she is currently on a wait list. Denies HA, fever, neck pain, sore throat, rash, CP, SOB, abd pain, nausea, emesis, diarrhea, dysuria, bowel or bladder dysfunction, or extremity edema, weakness, numbness, or tingling. No IV drug abuse. No other pertinent medical symptoms.   Past Medical History  Diagnosis Date  . COPD (chronic obstructive pulmonary disease)   . Depression   . Back pain     Past Surgical History  Procedure Date  . Cesarean section   . Cholecystectomy   . Abdominal hysterectomy     History reviewed. No pertinent family history.  History    Substance Use Topics  . Smoking status: Current Every Day Smoker    Types: Cigarettes  . Smokeless tobacco: Not on file  . Alcohol Use: No    OB History    Grav Para Term Preterm Abortions TAB SAB Ect Mult Living                  Review of Systems  Musculoskeletal: Positive for back pain.   10 Systems reviewed and all are negative for acute change except as noted in the HPI.   Allergies  Ciprofloxacin; Ketorolac tromethamine; Morphine and related; and Ultram  Home Medications   Current Outpatient Rx  Name  Route  Sig  Dispense  Refill  . ALBUTEROL SULFATE HFA 108 (90 BASE) MCG/ACT IN AERS   Inhalation   Inhale 2 puffs into the lungs every 4 (four) hours. Patient uses this medication for shortness of breath.         Marland Kitchen CITALOPRAM HYDROBROMIDE 20 MG PO TABS   Oral   Take 40 mg by mouth daily.          Marland Kitchen CLONAZEPAM 1 MG PO TABS   Oral   Take 1 mg by mouth 3 (three) times daily.         Marland Kitchen FLUTICASONE-SALMETEROL 100-50 MCG/DOSE IN AEPB   Inhalation   Inhale 1 puff into the lungs every 12 (twelve) hours. Patient is to use this medication for shortness of breath.         Marland Kitchen  IBUPROFEN 800 MG PO TABS   Oral   Take 800 mg by mouth every 8 (eight) hours as needed. Pain         . NON FORMULARY      Patient uses 2 liters of Oxygen at home         . TIOTROPIUM BROMIDE MONOHYDRATE 18 MCG IN CAPS   Inhalation   Place 18 mcg into inhaler and inhale daily.         . CYCLOBENZAPRINE HCL 5 MG PO TABS   Oral   Take 1 tablet (5 mg total) by mouth 3 (three) times daily as needed for muscle spasms.   20 tablet   0   . OXYCODONE-ACETAMINOPHEN 5-325 MG PO TABS   Oral   Take 1-2 tablets by mouth every 6 (six) hours as needed for pain.   15 tablet   0     BP 147/100  Pulse 100  Temp 98 F (36.7 C) (Oral)  Resp 20  SpO2 98%  Physical Exam  Nursing note and vitals reviewed. Constitutional:       Awake, alert, nontoxic appearance.  HENT:  Head:  Atraumatic.  Eyes: Right eye exhibits no discharge. Left eye exhibits no discharge.  Neck: Neck supple.  Pulmonary/Chest: Effort normal. She exhibits no tenderness.  Abdominal: Soft. There is no tenderness. There is no rebound.  Musculoskeletal: She exhibits tenderness (mild paraspinal lumbar tenderness, left greater than right).       Baseline ROM, no obvious new focal weakness. No midline spinal tenderness.  Neurological:       Mental status and motor strength appears baseline for patient and situation.  Skin: No rash noted.  Psychiatric: She has a normal mood and affect.    ED Course  Procedures (including critical care time)  COORDINATION OF CARE:  7:29PM - pain meds and muscle relaxnats will be ordered for Germain Osgood. She is advised to take it easy for the next couple of days but then remain active after her resting period. She is advised to keep trying to get an appointment with her center in [facility]. She is ready for d/c.   Labs Reviewed - No data to display No results found.   1. Lumbar radiculopathy       MDM  Pt presenting with increase in chronic low back pain with radiation to her legs.  No new recent injury, although she does state she falls frequently due to her left leg "giving out on her".  No fall causing this increase in pain.  No fever, no weakness of legs, no incontinence of bowel or bladder, no urinary retention.  Low concern for traumatic injury- pt is not tender over the midline spine, no signs or symptoms of cauda equina.  Pt given rx for pain meds, muscle relaxants, anti-inflammatory.  Discharged with strict return precautions.  Pt agreeable with plan.  I personally performed the services described in this documentation, which was scribed in my presence. The recorded information has been reviewed and is accurate.        Ethelda Chick, MD 09/13/12 [phone]

## 2012-09-11 NOTE — ED Notes (Signed)
Reports hx of back pain and leg pain, reports falling two weeks ago and now having more severe pain. Ambulatory at triage.

## 2012-11-28 ENCOUNTER — Encounter (HOSPITAL_BASED_OUTPATIENT_CLINIC_OR_DEPARTMENT_OTHER): Payer: Self-pay | Admitting: *Deleted

## 2012-11-28 ENCOUNTER — Emergency Department (HOSPITAL_BASED_OUTPATIENT_CLINIC_OR_DEPARTMENT_OTHER)
Admission: EM | Admit: 2012-11-28 | Discharge: 2012-11-28 | Disposition: A | Discharge status: Home / Self Care | Payer: Self-pay | Attending: Emergency Medicine | Admitting: Emergency Medicine

## 2012-11-28 DIAGNOSIS — J449 Chronic obstructive pulmonary disease, unspecified: Secondary | ICD-10-CM | POA: Insufficient documentation

## 2012-11-28 DIAGNOSIS — IMO0002 Reserved for concepts with insufficient information to code with codable children: Secondary | ICD-10-CM | POA: Insufficient documentation

## 2012-11-28 DIAGNOSIS — F329 Major depressive disorder, single episode, unspecified: Secondary | ICD-10-CM | POA: Insufficient documentation

## 2012-11-28 DIAGNOSIS — H9209 Otalgia, unspecified ear: Secondary | ICD-10-CM | POA: Insufficient documentation

## 2012-11-28 DIAGNOSIS — Z79899 Other long term (current) drug therapy: Secondary | ICD-10-CM | POA: Insufficient documentation

## 2012-11-28 DIAGNOSIS — F3289 Other specified depressive episodes: Secondary | ICD-10-CM | POA: Insufficient documentation

## 2012-11-28 DIAGNOSIS — M542 Cervicalgia: Secondary | ICD-10-CM | POA: Insufficient documentation

## 2012-11-28 DIAGNOSIS — Z791 Long term (current) use of non-steroidal anti-inflammatories (NSAID): Secondary | ICD-10-CM | POA: Insufficient documentation

## 2012-11-28 DIAGNOSIS — J4489 Other specified chronic obstructive pulmonary disease: Secondary | ICD-10-CM | POA: Insufficient documentation

## 2012-11-28 DIAGNOSIS — F17200 Nicotine dependence, unspecified, uncomplicated: Secondary | ICD-10-CM | POA: Insufficient documentation

## 2012-11-28 MED ORDER — ALBUTEROL SULFATE HFA 108 (90 BASE) MCG/ACT IN AERS: 2.0000 | INHALATION_SPRAY | RESPIRATORY_TRACT | Status: AC

## 2012-11-28 MED ORDER — MOMETASONE FUROATE 50 MCG/ACT NA SUSP: 2.0000 | Freq: Every day | NASAL | Status: DC

## 2012-11-28 MED ORDER — ANTIPYRINE-BENZOCAINE 5.4-1.4 % OT SOLN: 3.0000 [drp] | OTIC | Status: DC | PRN

## 2012-11-28 MED ORDER — FLUTICASONE-SALMETEROL 100-50 MCG/DOSE IN AEPB: 1.0000 | INHALATION_SPRAY | Freq: Two times a day (BID) | RESPIRATORY_TRACT | Status: DC

## 2012-11-28 MED ORDER — ALBUTEROL SULFATE HFA 108 (90 BASE) MCG/ACT IN AERS
INHALATION_SPRAY | RESPIRATORY_TRACT | Status: AC
  Administered 2012-11-28: 2 via RESPIRATORY_TRACT
  Filled 2012-11-28: qty 6.7

## 2012-11-28 MED ORDER — TIOTROPIUM BROMIDE MONOHYDRATE 18 MCG IN CAPS: 18.0000 ug | ORAL_CAPSULE | Freq: Every day | RESPIRATORY_TRACT | Status: DC

## 2012-11-28 MED ORDER — ALBUTEROL SULFATE HFA 108 (90 BASE) MCG/ACT IN AERS
2.0000 | INHALATION_SPRAY | RESPIRATORY_TRACT | Status: DC | PRN
  Administered 2012-11-28: 2 via RESPIRATORY_TRACT

## 2012-11-28 MED ORDER — ANTIPYRINE-BENZOCAINE 5.4-1.4 % OT SOLN
OTIC | Status: AC
  Administered 2012-11-28: 4 [drp] via OTIC
  Filled 2012-11-28: qty 10

## 2012-11-28 MED ORDER — ANTIPYRINE-BENZOCAINE 5.4-1.4 % OT SOLN
3.0000 [drp] | OTIC | Status: DC | PRN
  Administered 2012-11-28: 4 [drp] via OTIC

## 2012-11-28 MED ORDER — PSEUDOEPHEDRINE HCL ER 120 MG PO TB12: 120.0000 mg | ORAL_TABLET | Freq: Two times a day (BID) | ORAL | Status: DC

## 2012-11-28 NOTE — ED Notes (Signed)
Bilateral ear pain.

## 2012-11-28 NOTE — ED Provider Notes (Signed)
History  This chart was scribed for Shannon Munch, MD by Bennett Scrape, ED Scribe. This patient was seen in room MH02/MH02 and the patient's care was started at 6:28 PM.  CSN: [id number]  Arrival date & time 11/28/12  1815   First MD Initiated Contact with Patient 11/28/12 1828      Chief Complaint  Patient presents with  . Otalgia    The history is provided by the patient. No language interpreter was used.    Shannon Stevens is a 52 y.o. female who presents to the Emergency Department complaining of one week of gradual onset, gradually worsening, constant otalgia in the left ear that radiates down the left neck.  She reports two prior episodes of the same in September and October 2013 that were treated with bactrim ear drops. She reports using hydrogen peroxide and left over bactrim drops with no improvement. She also reports that she has been taking ibuprofen with mild improvement. She reports chronic back problems but denies changes. She denies confusion, fevers, chills, nausea and emesis as associated symptoms. She has a h/o COPD and has an albuterol inhaler at home. She denies having a current PCP but states that she signed up for disability 3 weeks ago but has not heard back yet.     Past Medical History  Diagnosis Date  . COPD (chronic obstructive pulmonary disease)   . Depression   . Back pain     Past Surgical History  Procedure Laterality Date  . Cesarean section    . Cholecystectomy    . Abdominal hysterectomy      No family history on file.  History  Substance Use Topics  . Smoking status: Current Every Day Smoker    Types: Cigarettes  . Smokeless tobacco: Not on file  . Alcohol Use: No    No OB history provided.  Review of Systems  Constitutional:       Per HPI, otherwise negative  HENT:       Per HPI, otherwise negative  Respiratory:       Per HPI, otherwise negative  Cardiovascular:       Per HPI, otherwise negative  Gastrointestinal: Negative  for vomiting.  Endocrine:       Negative aside from HPI  Genitourinary:       Neg aside from HPI   Musculoskeletal:       Per HPI, otherwise negative  Skin: Negative.   Neurological: Negative for syncope.    Allergies  Ciprofloxacin; Ketorolac tromethamine; Morphine and related; and Ultram  Home Medications   Current Outpatient Rx  Name  Route  Sig  Dispense  Refill  . traZODone (DESYREL) 50 MG tablet   Oral   Take 50 mg by mouth at bedtime.         Marland Kitchen albuterol (PROVENTIL HFA;VENTOLIN HFA) 108 (90 BASE) MCG/ACT inhaler   Inhalation   Inhale 2 puffs into the lungs every 4 (four) hours. Patient uses this medication for shortness of breath.         . citalopram (CELEXA) 20 MG tablet   Oral   Take 40 mg by mouth daily.          . clonazePAM (KLONOPIN) 1 MG tablet   Oral   Take 1 mg by mouth 3 (three) times daily.         . cyclobenzaprine (FLEXERIL) 5 MG tablet   Oral   Take 1 tablet (5 mg total) by mouth 3 (three) times daily as needed  for muscle spasms.   20 tablet   0   . Fluticasone-Salmeterol (ADVAIR DISKUS) 100-50 MCG/DOSE AEPB   Inhalation   Inhale 1 puff into the lungs every 12 (twelve) hours. Patient is to use this medication for shortness of breath.         Marland Kitchen ibuprofen (ADVIL,MOTRIN) 800 MG tablet   Oral   Take 800 mg by mouth every 8 (eight) hours as needed. Pain         . NON FORMULARY      Patient uses 2 liters of Oxygen at home         . oxyCODONE-acetaminophen (PERCOCET/ROXICET) 5-325 MG per tablet   Oral   Take 1-2 tablets by mouth every 6 (six) hours as needed for pain.   15 tablet   0   . tiotropium (SPIRIVA) 18 MCG inhalation capsule   Inhalation   Place 18 mcg into inhaler and inhale daily.           Triage Vitals: BP 124/88  Pulse 115  Temp(Src) 98.5 F (36.9 C) (Oral)  Resp 18  SpO2 98%  Physical Exam  Nursing note and vitals reviewed. Constitutional: She is oriented to person, place, and time. She appears  well-developed and well-nourished. No distress.  HENT:  Head: Normocephalic and atraumatic.  Mouth/Throat: Oropharynx is clear and moist.  Mild effusion in the left ear, right TM is normal, no trismus   Eyes: Conjunctivae and EOM are normal.  Neck: Neck supple.  Left lateral neck tenderness  Cardiovascular: Normal rate and regular rhythm.   Pulmonary/Chest: Effort normal and breath sounds normal. No stridor. No respiratory distress.  Abdominal: She exhibits no distension.  Musculoskeletal: She exhibits no edema.  Lymphadenopathy:    She has no cervical adenopathy.  Neurological: She is alert and oriented to person, place, and time. No cranial nerve deficit.  Skin: Skin is warm and dry.  Psychiatric: She has a normal mood and affect.    ED Course  Procedures (including critical care time)  DIAGNOSTIC STUDIES: Oxygen Saturation is 98% on room air, normal by my interpretation.    COORDINATION OF CARE: 6:41 PM-Discussed discharge plan which includes nasal steroid spray and sudafed with pt at bedside and pt agreed to plan. Advised pt that I don't believe that antibiotics will be helpful and pt agreed. Pt reports that she needs a refill on her COPD medications and I will refill them. Advised pt to return to the ED if needed.  Labs Reviewed - No data to display No results found.   1. Otalgia     Chart review demonstrates the patient has been treated for similar presentation several times within the recent past.  However, sharp he also demonstrates presentations to not have similar physical exam findings to today.  MDM   I personally performed the services described in this documentation, which was scribed in my presence. The recorded information has been reviewed and is accurate.  This patient, in no distress presents with concern of ongoing ear pain.  On exam she is neurologically intact, with a supple neck, and with her description of recurrent ear pain, there suspicion for  otalgia, possibly secondary to viral infection.  Absent distress, with stable vital signs, no evidence of neurologic compromise, no evidence of systemic infection, the patient was discharged with topical analgesia, decongestants, inhaled steroids.  I discussed both return precautions and followup instructions with the patient.   Shannon Munch, MD 11/28/12 1900

## 2013-01-09 ENCOUNTER — Emergency Department (HOSPITAL_BASED_OUTPATIENT_CLINIC_OR_DEPARTMENT_OTHER)
Admission: EM | Admit: 2013-01-09 | Discharge: 2013-01-09 | Disposition: A | Discharge status: Home / Self Care | Payer: Self-pay | Attending: Emergency Medicine | Admitting: Emergency Medicine

## 2013-01-09 ENCOUNTER — Encounter (HOSPITAL_BASED_OUTPATIENT_CLINIC_OR_DEPARTMENT_OTHER): Payer: Self-pay | Admitting: *Deleted

## 2013-01-09 ENCOUNTER — Emergency Department (HOSPITAL_BASED_OUTPATIENT_CLINIC_OR_DEPARTMENT_OTHER): Payer: Self-pay

## 2013-01-09 DIAGNOSIS — Y929 Unspecified place or not applicable: Secondary | ICD-10-CM | POA: Insufficient documentation

## 2013-01-09 DIAGNOSIS — F3289 Other specified depressive episodes: Secondary | ICD-10-CM | POA: Insufficient documentation

## 2013-01-09 DIAGNOSIS — Z79899 Other long term (current) drug therapy: Secondary | ICD-10-CM | POA: Insufficient documentation

## 2013-01-09 DIAGNOSIS — J449 Chronic obstructive pulmonary disease, unspecified: Secondary | ICD-10-CM | POA: Insufficient documentation

## 2013-01-09 DIAGNOSIS — S8990XA Unspecified injury of unspecified lower leg, initial encounter: Secondary | ICD-10-CM | POA: Insufficient documentation

## 2013-01-09 DIAGNOSIS — R296 Repeated falls: Secondary | ICD-10-CM | POA: Insufficient documentation

## 2013-01-09 DIAGNOSIS — Z8739 Personal history of other diseases of the musculoskeletal system and connective tissue: Secondary | ICD-10-CM | POA: Insufficient documentation

## 2013-01-09 DIAGNOSIS — Y939 Activity, unspecified: Secondary | ICD-10-CM | POA: Insufficient documentation

## 2013-01-09 DIAGNOSIS — M25561 Pain in right knee: Secondary | ICD-10-CM

## 2013-01-09 DIAGNOSIS — M79604 Pain in right leg: Secondary | ICD-10-CM

## 2013-01-09 DIAGNOSIS — IMO0002 Reserved for concepts with insufficient information to code with codable children: Secondary | ICD-10-CM | POA: Insufficient documentation

## 2013-01-09 DIAGNOSIS — Z9889 Other specified postprocedural states: Secondary | ICD-10-CM | POA: Insufficient documentation

## 2013-01-09 DIAGNOSIS — E669 Obesity, unspecified: Secondary | ICD-10-CM | POA: Insufficient documentation

## 2013-01-09 DIAGNOSIS — F17200 Nicotine dependence, unspecified, uncomplicated: Secondary | ICD-10-CM | POA: Insufficient documentation

## 2013-01-09 DIAGNOSIS — R000 Tachycardia, unspecified: Secondary | ICD-10-CM | POA: Insufficient documentation

## 2013-01-09 DIAGNOSIS — J4489 Other specified chronic obstructive pulmonary disease: Secondary | ICD-10-CM | POA: Insufficient documentation

## 2013-01-09 DIAGNOSIS — Z8781 Personal history of (healed) traumatic fracture: Secondary | ICD-10-CM | POA: Insufficient documentation

## 2013-01-09 DIAGNOSIS — S99919A Unspecified injury of unspecified ankle, initial encounter: Secondary | ICD-10-CM | POA: Insufficient documentation

## 2013-01-09 DIAGNOSIS — F329 Major depressive disorder, single episode, unspecified: Secondary | ICD-10-CM | POA: Insufficient documentation

## 2013-01-09 MED ORDER — HYDROCODONE-ACETAMINOPHEN 5-325 MG PO TABS
1.0000 | ORAL_TABLET | Freq: Once | ORAL | Status: AC
  Administered 2013-01-09: 1 via ORAL
  Filled 2013-01-09: qty 1

## 2013-01-09 NOTE — ED Provider Notes (Signed)
Medical screening examination/treatment/procedure(s) were performed by non-physician practitioner and as supervising physician I was immediately available for consultation/collaboration.   Gwyneth Sprout, MD 01/09/13 2311

## 2013-01-09 NOTE — ED Provider Notes (Signed)
History     CSN: [id number]  Arrival date & time 01/09/13  1457   First MD Initiated Contact with Patient 01/09/13 1505      Chief Complaint  Patient presents with  . Leg Pain    (Consider location/radiation/quality/duration/timing/severity/associated sxs/prior treatment) HPI Comments: 52 y/o female presents to the ED complaining of right knee and leg pain x 4 days worsening today. States two weeks ago she fell outside and landed on her legs, however did not have any pain until 4 days ago. Describes the pain as constant, sharp/throbbing rated 8/10 worse with walking. Tried taking ibuprofen without relief along with elevating her leg. She had rods placed in her leg 23 years ago after a fracture and is concerned something could be wrong with them. Denies redness, swelling, bruising, numbness or tingling in her leg.   Patient is a 52 y.o. female presenting with leg pain. The history is provided by the patient.  Leg Pain   Past Medical History  Diagnosis Date  . COPD (chronic obstructive pulmonary disease)   . Depression   . Back pain     Past Surgical History  Procedure Laterality Date  . Cesarean section    . Cholecystectomy    . Abdominal hysterectomy      No family history on file.  History  Substance Use Topics  . Smoking status: Current Every Day Smoker    Types: Cigarettes  . Smokeless tobacco: Not on file  . Alcohol Use: No    OB History   Grav Para Term Preterm Abortions TAB SAB Ect Mult Living                  Review of Systems  Musculoskeletal: Positive for arthralgias (positive for right knee and leg pain) and gait problem.  Skin: Negative for color change and wound.  All other systems reviewed and are negative.    Allergies  Ciprofloxacin; Ketorolac tromethamine; Morphine and related; and Ultram  Home Medications   Current Outpatient Rx  Name  Route  Sig  Dispense  Refill  . albuterol (PROVENTIL HFA;VENTOLIN HFA) 108 (90 BASE) MCG/ACT  inhaler   Inhalation   Inhale 2 puffs into the lungs every 4 (four) hours. Patient uses this medication for shortness of breath.   1 Inhaler   1   . antipyrine-benzocaine (AURALGAN) otic solution   Both Ears   Place 3 drops into both ears every 2 (two) hours as needed for pain.   10 mL   0   . citalopram (CELEXA) 20 MG tablet   Oral   Take 40 mg by mouth daily.          . clonazePAM (KLONOPIN) 1 MG tablet   Oral   Take 1 mg by mouth 3 (three) times daily.         . cyclobenzaprine (FLEXERIL) 5 MG tablet   Oral   Take 1 tablet (5 mg total) by mouth 3 (three) times daily as needed for muscle spasms.   20 tablet   0   . Fluticasone-Salmeterol (ADVAIR DISKUS) 100-50 MCG/DOSE AEPB   Inhalation   Inhale 1 puff into the lungs every 12 (twelve) hours. Patient is to use this medication for shortness of breath.   60 each   1   . ibuprofen (ADVIL,MOTRIN) 800 MG tablet   Oral   Take 800 mg by mouth every 8 (eight) hours as needed. Pain         . mometasone (NASONEX) 50  MCG/ACT nasal spray   Nasal   Place 2 sprays into the nose daily.   17 g   0     Use for three days only   . NON FORMULARY      Patient uses 2 liters of Oxygen at home         . oxyCODONE-acetaminophen (PERCOCET/ROXICET) 5-325 MG per tablet   Oral   Take 1-2 tablets by mouth every 6 (six) hours as needed for pain.   15 tablet   0   . pseudoephedrine (SUDAFED 12 HOUR) 120 MG 12 hr tablet   Oral   Take 1 tablet (120 mg total) by mouth every 12 (twelve) hours.   15 tablet   0   . tiotropium (SPIRIVA) 18 MCG inhalation capsule   Inhalation   Place 1 capsule (18 mcg total) into inhaler and inhale daily.   30 capsule   1   . traZODone (DESYREL) 50 MG tablet   Oral   Take 50 mg by mouth at bedtime.           BP 126/85  Pulse 105  Temp(Src) 97.4 F (36.3 C) (Oral)  Resp 20  Wt 249 lb (112.946 kg)  BMI 37.87 kg/m2  SpO2 94%  Physical Exam  Nursing note and vitals  reviewed. Constitutional: She is oriented to person, place, and time. She appears well-developed. No distress.  Obese  HENT:  Head: Normocephalic and atraumatic.  Mouth/Throat: Oropharynx is clear and moist.  Eyes: Conjunctivae are normal.  Neck: Normal range of motion. Neck supple.  Cardiovascular: Regular rhythm, normal heart sounds and intact distal pulses.  Tachycardia present.   Pulses:      Dorsalis pedis pulses are 2+ on the right side, and 2+ on the left side.       Posterior tibial pulses are 2+ on the right side, and 2+ on the left side.  Pulmonary/Chest: Breath sounds normal. No respiratory distress.  Musculoskeletal: She exhibits no edema.       Right knee: She exhibits normal range of motion (pain with flexion), no swelling, no effusion, no deformity and no erythema. Tenderness found. Patellar tendon tenderness noted.       Right ankle: Normal.       Right lower leg: She exhibits bony tenderness. She exhibits no swelling and no deformity.       Legs: Neurological: She is alert and oriented to person, place, and time. She has normal strength. No sensory deficit. Gait normal.  Skin: Skin is warm, dry and intact. No bruising and no ecchymosis noted. No erythema.  Psychiatric: She has a normal mood and affect. Her behavior is normal.    ED Course  Procedures (including critical care time)  Labs Reviewed - No data to display Dg Tibia/fibula Right  01/09/2013  *RADIOLOGY REPORT*  Clinical Data: Right leg pain  RIGHT TIBIA AND FIBULA - 2 VIEW  Comparison: January 13, 2010.  Findings: Status post intramedullary rod fixation for old healed fracture of distal tibia.  Old fractures involving midshaft of the right fibula are also noted.  These are unchanged compared to prior exam.  No acute fracture or dislocation is noted.  IMPRESSION: Old tibial and fibular fractures as described above.  No acute abnormality seen.   Original Report Authenticated By: Lupita Raider.,  M.D.    Dg Knee  Complete 4 Views Right  01/09/2013  *RADIOLOGY REPORT*  Clinical Data: Knee pain  RIGHT KNEE - COMPLETE 4+ VIEW  Comparison:  01/13/2010  Findings: Four views study shows no fracture.  No subluxation or dislocation.  No joint effusion.  Mild hypertrophic spurring is visible in all three compartments.  Antegrade IM nail in the tibia is incompletely visualized.  Bones are demineralized.  IMPRESSION: Stable.  No evidence for acute bony abnormality.   Original Report Authenticated By: Kennith Center, M.D.      1. Knee pain, right   2. Leg pain, right       MDM  52 y/o female with knee and leg pain s/p fall. Xrays without any acute abnormality as shown above. Pain improved with vicodin. She is able to ambulate without difficulty. Advised rest, ice, elevation and ibuprofen. I do not feel any narcotic pain medication is necessary at discharge at this time. She has no PCP. Resource guide given for PCP f/u. Patient states understanding of plan and is agreeable.        Trevor Mace, PA-C 01/09/13 1605

## 2013-01-09 NOTE — ED Notes (Signed)
Right leg pain 

## 2013-05-09 ENCOUNTER — Encounter (HOSPITAL_BASED_OUTPATIENT_CLINIC_OR_DEPARTMENT_OTHER): Payer: Self-pay | Admitting: *Deleted

## 2013-05-09 ENCOUNTER — Emergency Department (HOSPITAL_BASED_OUTPATIENT_CLINIC_OR_DEPARTMENT_OTHER)
Admission: EM | Admit: 2013-05-09 | Discharge: 2013-05-09 | Disposition: A | Discharge status: Home / Self Care | Payer: Self-pay | Attending: Emergency Medicine | Admitting: Emergency Medicine

## 2013-05-09 ENCOUNTER — Emergency Department (HOSPITAL_BASED_OUTPATIENT_CLINIC_OR_DEPARTMENT_OTHER): Payer: Self-pay

## 2013-05-09 DIAGNOSIS — F17200 Nicotine dependence, unspecified, uncomplicated: Secondary | ICD-10-CM | POA: Insufficient documentation

## 2013-05-09 DIAGNOSIS — F329 Major depressive disorder, single episode, unspecified: Secondary | ICD-10-CM | POA: Insufficient documentation

## 2013-05-09 DIAGNOSIS — R071 Chest pain on breathing: Secondary | ICD-10-CM | POA: Insufficient documentation

## 2013-05-09 DIAGNOSIS — J449 Chronic obstructive pulmonary disease, unspecified: Secondary | ICD-10-CM | POA: Insufficient documentation

## 2013-05-09 DIAGNOSIS — F3289 Other specified depressive episodes: Secondary | ICD-10-CM | POA: Insufficient documentation

## 2013-05-09 DIAGNOSIS — M549 Dorsalgia, unspecified: Secondary | ICD-10-CM | POA: Insufficient documentation

## 2013-05-09 DIAGNOSIS — R0789 Other chest pain: Secondary | ICD-10-CM

## 2013-05-09 DIAGNOSIS — Z79899 Other long term (current) drug therapy: Secondary | ICD-10-CM | POA: Insufficient documentation

## 2013-05-09 DIAGNOSIS — J4489 Other specified chronic obstructive pulmonary disease: Secondary | ICD-10-CM | POA: Insufficient documentation

## 2013-05-09 MED ORDER — HYDROCODONE-ACETAMINOPHEN 5-325 MG PO TABS: 2.0000 | ORAL_TABLET | ORAL | Status: DC | PRN

## 2013-05-09 MED ORDER — IBUPROFEN 800 MG PO TABS: 800.0000 mg | ORAL_TABLET | Freq: Three times a day (TID) | ORAL | Status: DC | PRN

## 2013-05-09 NOTE — ED Provider Notes (Signed)
Medical screening examination/treatment/procedure(s) were performed by non-physician practitioner and as supervising physician I was immediately available for consultation/collaboration.   Fredda Clarida B. Theoden Mauch, MD 05/09/13 2354 

## 2013-05-09 NOTE — ED Notes (Signed)
Pain in left rib cage area a particular spot that is swollen and painful per patient states noticed 2 months ago but is getting so bad she has difficulty sleeping. No known injury

## 2013-05-09 NOTE — ED Provider Notes (Signed)
CSN: [id number]     Arrival date & time 05/09/13  1627 History     First MD Initiated Contact with Patient 05/09/13 1717     Chief Complaint  Patient presents with  . pain in rib area left side    (Consider location/radiation/quality/duration/timing/severity/associated sxs/prior Treatment) Patient is a 52 y.o. female presenting with chest pain. The history is provided by the patient. No language interpreter was used.  Chest Pain Pain location:  L chest Pain quality: aching   Pain radiates to:  Does not radiate Pain radiates to the back: no   Pain severity:  Moderate Onset quality:  Gradual Timing:  Constant Progression:  Worsening Chronicity:  New Relieved by:  None tried Worsened by:  Nothing tried Pt complains of a sore area to left chest.   Pt reports she has a lump to the area that is getting bigger  Past Medical History  Diagnosis Date  . COPD (chronic obstructive pulmonary disease)   . Depression   . Back pain    Past Surgical History  Procedure Laterality Date  . Cesarean section    . Cholecystectomy    . Abdominal hysterectomy     History reviewed. No pertinent family history. History  Substance Use Topics  . Smoking status: Current Every Day Smoker    Types: Cigarettes  . Smokeless tobacco: Not on file  . Alcohol Use: No   OB History   Grav Para Term Preterm Abortions TAB SAB Ect Mult Living                 Review of Systems  Cardiovascular: Positive for chest pain.  All other systems reviewed and are negative.    Allergies  Ciprofloxacin; Ketorolac tromethamine; Morphine and related; and Ultram  Home Medications   Current Outpatient Rx  Name  Route  Sig  Dispense  Refill  . albuterol (PROVENTIL HFA;VENTOLIN HFA) 108 (90 BASE) MCG/ACT inhaler   Inhalation   Inhale 2 puffs into the lungs every 4 (four) hours. Patient uses this medication for shortness of breath.   1 Inhaler   1   . antipyrine-benzocaine (AURALGAN) otic solution   Both  Ears   Place 3 drops into both ears every 2 (two) hours as needed for pain.   10 mL   0   . citalopram (CELEXA) 20 MG tablet   Oral   Take 40 mg by mouth daily.          . clonazePAM (KLONOPIN) 1 MG tablet   Oral   Take 1 mg by mouth 3 (three) times daily.         . cyclobenzaprine (FLEXERIL) 5 MG tablet   Oral   Take 1 tablet (5 mg total) by mouth 3 (three) times daily as needed for muscle spasms.   20 tablet   0   . Fluticasone-Salmeterol (ADVAIR DISKUS) 100-50 MCG/DOSE AEPB   Inhalation   Inhale 1 puff into the lungs every 12 (twelve) hours. Patient is to use this medication for shortness of breath.   60 each   1   . ibuprofen (ADVIL,MOTRIN) 800 MG tablet   Oral   Take 800 mg by mouth every 8 (eight) hours as needed. Pain         . mometasone (NASONEX) 50 MCG/ACT nasal spray   Nasal   Place 2 sprays into the nose daily.   17 g   0     Use for three days only   . NON  FORMULARY      Patient uses 2 liters of Oxygen at home         . oxyCODONE-acetaminophen (PERCOCET/ROXICET) 5-325 MG per tablet   Oral   Take 1-2 tablets by mouth every 6 (six) hours as needed for pain.   15 tablet   0   . pseudoephedrine (SUDAFED 12 HOUR) 120 MG 12 hr tablet   Oral   Take 1 tablet (120 mg total) by mouth every 12 (twelve) hours.   15 tablet   0   . tiotropium (SPIRIVA) 18 MCG inhalation capsule   Inhalation   Place 1 capsule (18 mcg total) into inhaler and inhale daily.   30 capsule   1   . traZODone (DESYREL) 50 MG tablet   Oral   Take 50 mg by mouth at bedtime.          BP 142/86  Pulse 106  Temp(Src) 98.5 F (36.9 C) (Oral)  Resp 18  Ht 5\' 8"  (1.727 m)  Wt 241 lb (109.317 kg)  BMI 36.65 kg/m2  SpO2 98% Physical Exam  Nursing note and vitals reviewed. Constitutional: She appears well-developed and well-nourished.  Neck: Normal range of motion.  Cardiovascular: Normal rate.   Pulmonary/Chest: Effort normal.  Tender area left anterior chest,   Feels like fatty tissue, possible lipoma.     Abdominal: Soft.  Musculoskeletal: Normal range of motion.  Neurological: She is alert.  Skin: Skin is warm.    ED Course   Procedures (including critical care time)  Labs Reviewed - No data to display Dg Ribs Unilateral W/chest Left  05/09/2013   *RADIOLOGY REPORT*  Clinical Data: Left anterior rib pain and tenderness  LEFT RIBS AND CHEST - 3+ VIEW  Comparison: 04/01/2011  Findings: The heart size and mediastinal contours are within normal limits.  Both lungs are clear.  The visualized skeletal structures are unremarkable. No displaced rib fractures are identified.  IMPRESSION:  1.  No acute findings identified. 2.  No rib deformities noted.   Original Report Authenticated By: Signa Kell, M.D.   1. Chest wall pain     MDM  Pt given rx for ibuprofen and hydrocodone.   Pt referred to [facility] [place] [facility]  Elson Areas, PA-C 05/09/13 1821  [facility], [place] 05/09/13 [phone]

## 2013-05-09 NOTE — ED Notes (Signed)
Patient transported to X-ray 

## 2013-06-29 ENCOUNTER — Emergency Department (HOSPITAL_BASED_OUTPATIENT_CLINIC_OR_DEPARTMENT_OTHER): Payer: Self-pay

## 2013-06-29 ENCOUNTER — Emergency Department (HOSPITAL_BASED_OUTPATIENT_CLINIC_OR_DEPARTMENT_OTHER)
Admission: EM | Admit: 2013-06-29 | Discharge: 2013-06-29 | Disposition: A | Discharge status: Home / Self Care | Payer: Self-pay | Attending: Emergency Medicine | Admitting: Emergency Medicine

## 2013-06-29 ENCOUNTER — Encounter (HOSPITAL_BASED_OUTPATIENT_CLINIC_OR_DEPARTMENT_OTHER): Payer: Self-pay

## 2013-06-29 DIAGNOSIS — M7989 Other specified soft tissue disorders: Secondary | ICD-10-CM | POA: Insufficient documentation

## 2013-06-29 DIAGNOSIS — J4489 Other specified chronic obstructive pulmonary disease: Secondary | ICD-10-CM | POA: Insufficient documentation

## 2013-06-29 DIAGNOSIS — F17200 Nicotine dependence, unspecified, uncomplicated: Secondary | ICD-10-CM | POA: Insufficient documentation

## 2013-06-29 DIAGNOSIS — IMO0002 Reserved for concepts with insufficient information to code with codable children: Secondary | ICD-10-CM | POA: Insufficient documentation

## 2013-06-29 DIAGNOSIS — M79609 Pain in unspecified limb: Secondary | ICD-10-CM | POA: Insufficient documentation

## 2013-06-29 DIAGNOSIS — J449 Chronic obstructive pulmonary disease, unspecified: Secondary | ICD-10-CM | POA: Insufficient documentation

## 2013-06-29 DIAGNOSIS — F329 Major depressive disorder, single episode, unspecified: Secondary | ICD-10-CM | POA: Insufficient documentation

## 2013-06-29 DIAGNOSIS — Z79899 Other long term (current) drug therapy: Secondary | ICD-10-CM | POA: Insufficient documentation

## 2013-06-29 DIAGNOSIS — F3289 Other specified depressive episodes: Secondary | ICD-10-CM | POA: Insufficient documentation

## 2013-06-29 DIAGNOSIS — M79604 Pain in right leg: Secondary | ICD-10-CM

## 2013-06-29 DIAGNOSIS — R52 Pain, unspecified: Secondary | ICD-10-CM

## 2013-06-29 MED ORDER — HYDROCODONE-ACETAMINOPHEN 5-325 MG PO TABS: 2.0000 | ORAL_TABLET | ORAL | Status: DC | PRN

## 2013-06-29 MED ORDER — MELOXICAM 7.5 MG PO TABS: 7.5000 mg | ORAL_TABLET | Freq: Every day | ORAL | Status: DC

## 2013-06-29 NOTE — ED Notes (Signed)
Pt reports right knee pain radiating to distal aspect of leg.  Denies injury.

## 2013-06-29 NOTE — ED Provider Notes (Signed)
CSN: [id number]     Arrival date & time 06/29/13  1236 History   First MD Initiated Contact with Patient 06/29/13 1247     Chief Complaint  Patient presents with  . Leg Pain   (Consider location/radiation/quality/duration/timing/severity/associated sxs/prior Treatment) Patient is a 52 y.o. female presenting with leg pain. The history is provided by the patient. No language interpreter was used.  Leg Pain Location:  Leg Injury: no   Leg location:  R leg Pain details:    Quality:  Aching   Radiates to:  Does not radiate   Severity:  Moderate   Onset quality:  Gradual   Timing:  Constant   Progression:  Worsening Chronicity:  Recurrent Dislocation: no   Foreign body present:  No foreign bodies Prior injury to area:  No Ineffective treatments:  None tried Pt complains of swelling to right knee.  Pt reports pain to right knee and swelling to lower leg.  Pt reports pain with walking.  Pt reports she feels like knee and ankle are unstable.    Past Medical History  Diagnosis Date  . COPD (chronic obstructive pulmonary disease)   . Depression   . Back pain    Past Surgical History  Procedure Laterality Date  . Cesarean section    . Cholecystectomy    . Abdominal hysterectomy     No family history on file. History  Substance Use Topics  . Smoking status: Current Every Day Smoker    Types: Cigarettes  . Smokeless tobacco: Not on file  . Alcohol Use: No   OB History   Grav Para Term Preterm Abortions TAB SAB Ect Mult Living                 Review of Systems  Musculoskeletal: Positive for myalgias.  Skin: Negative for color change.  All other systems reviewed and are negative.    Allergies  Ciprofloxacin; Ketorolac tromethamine; Morphine and related; and Ultram  Home Medications   Current Outpatient Rx  Name  Route  Sig  Dispense  Refill  . albuterol (PROVENTIL HFA;VENTOLIN HFA) 108 (90 BASE) MCG/ACT inhaler   Inhalation   Inhale 2 puffs into the lungs every 4  (four) hours. Patient uses this medication for shortness of breath.   1 Inhaler   1   . antipyrine-benzocaine (AURALGAN) otic solution   Both Ears   Place 3 drops into both ears every 2 (two) hours as needed for pain.   10 mL   0   . citalopram (CELEXA) 20 MG tablet   Oral   Take 40 mg by mouth daily.          . clonazePAM (KLONOPIN) 1 MG tablet   Oral   Take 1 mg by mouth 3 (three) times daily.         . cyclobenzaprine (FLEXERIL) 5 MG tablet   Oral   Take 1 tablet (5 mg total) by mouth 3 (three) times daily as needed for muscle spasms.   20 tablet   0   . Fluticasone-Salmeterol (ADVAIR DISKUS) 100-50 MCG/DOSE AEPB   Inhalation   Inhale 1 puff into the lungs every 12 (twelve) hours. Patient is to use this medication for shortness of breath.   60 each   1   . HYDROcodone-acetaminophen (NORCO/VICODIN) 5-325 MG per tablet   Oral   Take 2 tablets by mouth every 4 (four) hours as needed.   20 tablet   0   . ibuprofen (ADVIL,MOTRIN) 800 MG  tablet   Oral   Take 1 tablet (800 mg total) by mouth every 8 (eight) hours as needed. Pain   20 tablet   0   . mometasone (NASONEX) 50 MCG/ACT nasal spray   Nasal   Place 2 sprays into the nose daily.   17 g   0     Use for three days only   . NON FORMULARY      Patient uses 2 liters of Oxygen at home         . oxyCODONE-acetaminophen (PERCOCET/ROXICET) 5-325 MG per tablet   Oral   Take 1-2 tablets by mouth every 6 (six) hours as needed for pain.   15 tablet   0   . pseudoephedrine (SUDAFED 12 HOUR) 120 MG 12 hr tablet   Oral   Take 1 tablet (120 mg total) by mouth every 12 (twelve) hours.   15 tablet   0   . tiotropium (SPIRIVA) 18 MCG inhalation capsule   Inhalation   Place 1 capsule (18 mcg total) into inhaler and inhale daily.   30 capsule   1   . traZODone (DESYREL) 50 MG tablet   Oral   Take 50 mg by mouth at bedtime.          BP 146/90  Pulse 104  Temp(Src) 98.3 F (36.8 C) (Oral)  Resp 18   Ht 5\' 8"  (1.727 m)  Wt 245 lb (111.131 kg)  BMI 37.26 kg/m2  SpO2 97% Physical Exam  Nursing note and vitals reviewed. Constitutional: She is oriented to person, place, and time. She appears well-developed and well-nourished.  HENT:  Head: Normocephalic.  Cardiovascular: Normal rate.   Pulmonary/Chest: Effort normal.  Musculoskeletal: She exhibits tenderness.  Right knee swollen,  Swelling lower leg,  Diffusely tender,  nv and ns intact  Neurological: She is alert and oriented to person, place, and time. She has normal reflexes.  Skin: Skin is warm.  Psychiatric: She has a normal mood and affect.    ED Course  Procedures (including critical care time) Labs Review Labs Reviewed - No data to display Imaging Review Dg Tibia/fibula Right  06/29/2013   CLINICAL DATA:  History of prior fracture.  Right lower leg pain.  EXAM: RIGHT TIBIA AND FIBULA - 2 VIEW  COMPARISON:  Plain films 01/09/2013.  FINDINGS: Remote healed fractures of the tibia and fibula are again seen. IM nail in the tibia is noted. There is no acute fracture. Plantar calcaneal spur is incidentally noted.  IMPRESSION: No acute finding. Stable compared to prior exam with old healed tibial and fibular fractures noted.   Electronically Signed   By: Drusilla Kanner M.D.   On: 06/29/2013 14:11   US Venous Img Lower Unilateral Right  06/29/2013   CLINICAL DATA:  Right lower extremity pain and swelling  EXAM: RIGHT LOWER EXTREMITY VENOUS DUPLEX ULTRASOUND  TECHNIQUE: Gray-scale sonography with graded compression, as well as color Doppler and duplex ultrasound, were performed to evaluate the deep venous system from the level of the common femoral vein through the popliteal and proximal calf veins. Spectral Doppler was utilized to evaluate flow at rest and with distal augmentation maneuvers.  COMPARISON:  None.  FINDINGS: The flow in the venous structures of the right lower extremity is spontaneous and phasic in all segments. There is  normal compression and augmentation throughout the venous structures of the right lower extremity. Venous Doppler signal is normal in all regions. There is no thrombus in the deep or  visualized superficial venous structures on the right. There is no right-sided deep venous incompetence.  IMPRESSION: No evidence of right lower extremity deep venous thrombosis.   Electronically Signed   By: Bretta Bang   On: 06/29/2013 14:31    MDM   1. Pain    Pt advised to see Dr. Charlann Boxer for recheck.  Ice to area of swelling.   Pt given hydrocodone for pain.   meloxicam rx to try.    [facility]onia Sk[facility] [place], PA-C 06/29/13 1455

## 2013-06-30 NOTE — ED Provider Notes (Signed)
Medical screening examination/treatment/procedure(s) were performed by non-physician practitioner and as supervising physician I was immediately available for consultation/collaboration.   Gwyneth Sprout, MD 06/30/13 [phone]

## 2013-09-21 ENCOUNTER — Emergency Department (HOSPITAL_BASED_OUTPATIENT_CLINIC_OR_DEPARTMENT_OTHER): Payer: Self-pay

## 2013-09-21 ENCOUNTER — Encounter (HOSPITAL_BASED_OUTPATIENT_CLINIC_OR_DEPARTMENT_OTHER): Payer: Self-pay | Admitting: Emergency Medicine

## 2013-09-21 ENCOUNTER — Emergency Department (HOSPITAL_BASED_OUTPATIENT_CLINIC_OR_DEPARTMENT_OTHER)
Admission: EM | Admit: 2013-09-21 | Discharge: 2013-09-21 | Disposition: A | Discharge status: Home / Self Care | Payer: Self-pay | Attending: Emergency Medicine | Admitting: Emergency Medicine

## 2013-09-21 DIAGNOSIS — Z791 Long term (current) use of non-steroidal anti-inflammatories (NSAID): Secondary | ICD-10-CM | POA: Insufficient documentation

## 2013-09-21 DIAGNOSIS — F329 Major depressive disorder, single episode, unspecified: Secondary | ICD-10-CM | POA: Insufficient documentation

## 2013-09-21 DIAGNOSIS — Z79899 Other long term (current) drug therapy: Secondary | ICD-10-CM | POA: Insufficient documentation

## 2013-09-21 DIAGNOSIS — F3289 Other specified depressive episodes: Secondary | ICD-10-CM | POA: Insufficient documentation

## 2013-09-21 DIAGNOSIS — F17200 Nicotine dependence, unspecified, uncomplicated: Secondary | ICD-10-CM | POA: Insufficient documentation

## 2013-09-21 DIAGNOSIS — J441 Chronic obstructive pulmonary disease with (acute) exacerbation: Secondary | ICD-10-CM | POA: Insufficient documentation

## 2013-09-21 DIAGNOSIS — IMO0002 Reserved for concepts with insufficient information to code with codable children: Secondary | ICD-10-CM | POA: Insufficient documentation

## 2013-09-21 DIAGNOSIS — R0789 Other chest pain: Secondary | ICD-10-CM | POA: Insufficient documentation

## 2013-09-21 LAB — CBC WITH DIFFERENTIAL/PLATELET
Basophils Absolute: 0 10*3/uL (ref 0.0–0.1)
Basophils Relative: 0 % (ref 0–1)
Eosinophils Relative: 1 % (ref 0–5)
Lymphocytes Relative: 31 % (ref 12–46)
MCHC: 34.1 g/dL (ref 30.0–36.0)
MCV: 87.7 fL (ref 78.0–100.0)
Monocytes Absolute: 0.6 10*3/uL (ref 0.1–1.0)
Platelets: 270 10*3/uL (ref 150–400)
RDW: 12.8 % (ref 11.5–15.5)
WBC: 9.3 10*3/uL (ref 4.0–10.5)

## 2013-09-21 LAB — COMPREHENSIVE METABOLIC PANEL
ALT: 17 U/L (ref 0–35)
AST: 16 U/L (ref 0–37)
Albumin: 3.9 g/dL (ref 3.5–5.2)
CO2: 24 mEq/L (ref 19–32)
Calcium: 9.3 mg/dL (ref 8.4–10.5)
Creatinine, Ser: 0.8 mg/dL (ref 0.50–1.10)
GFR calc non Af Amer: 83 mL/min — ABNORMAL LOW (ref >90)
Sodium: 138 mEq/L (ref 135–145)
Total Protein: 7.2 g/dL (ref 6.0–8.3)

## 2013-09-21 MED ORDER — ASPIRIN 325 MG PO TABS
325.0000 mg | ORAL_TABLET | ORAL | Status: AC
  Administered 2013-09-21: 325 mg via ORAL
  Filled 2013-09-21: qty 1

## 2013-09-21 MED ORDER — RANITIDINE HCL 150 MG PO CAPS: 150.0000 mg | ORAL_CAPSULE | Freq: Every day | ORAL | Status: DC

## 2013-09-21 NOTE — ED Provider Notes (Signed)
CSN: [id number]     Arrival date & time 09/21/13  1237 History   First MD Initiated Contact with Patient 09/21/13 1314     Chief Complaint  Patient presents with  . Chest Pain   (Consider location/radiation/quality/duration/timing/severity/associated sxs/prior Treatment) Patient is a 52 y.o. female presenting with chest pain. The history is provided by the patient.  Chest Pain Chest pain location: central chest. Pain quality: sharp   Pain radiates to:  Does not radiate Pain radiates to the back: no   Pain severity:  Moderate Onset quality:  Sudden Duration:  10 minutes Timing:  Intermittent Progression:  Unchanged Chronicity:  New Context: at rest   Relieved by:  Nothing Worsened by:  Nothing tried Ineffective treatments:  None tried Associated symptoms: cough (mild) and shortness of breath (has copd, mild sob at baseline is unchanged)   Associated symptoms: no abdominal pain, no back pain, no dizziness, no fatigue, no fever, no headache, no nausea and not vomiting     Past Medical History  Diagnosis Date  . COPD (chronic obstructive pulmonary disease)   . Depression   . Back pain    Past Surgical History  Procedure Laterality Date  . Cesarean section    . Cholecystectomy    . Abdominal hysterectomy     History reviewed. No pertinent family history. History  Substance Use Topics  . Smoking status: Current Every Day Smoker    Types: Cigarettes  . Smokeless tobacco: Not on file  . Alcohol Use: No   OB History   Grav Para Term Preterm Abortions TAB SAB Ect Mult Living                 Review of Systems  Constitutional: Negative for fever and fatigue.  HENT: Negative for congestion and drooling.   Eyes: Negative for pain.  Respiratory: Positive for cough (mild) and shortness of breath (has copd, mild sob at baseline is unchanged).   Cardiovascular: Positive for chest pain.  Gastrointestinal: Negative for nausea, vomiting, abdominal pain and diarrhea.   Genitourinary: Negative for dysuria and hematuria.  Musculoskeletal: Negative for back pain, gait problem and neck pain.  Skin: Negative for color change.  Neurological: Negative for dizziness and headaches.  Hematological: Negative for adenopathy.  Psychiatric/Behavioral: Negative for behavioral problems.  All other systems reviewed and are negative.    Allergies  Ciprofloxacin; Ketorolac tromethamine; Morphine and related; and Ultram  Home Medications   Current Outpatient Rx  Name  Route  Sig  Dispense  Refill  . albuterol (PROVENTIL HFA;VENTOLIN HFA) 108 (90 BASE) MCG/ACT inhaler   Inhalation   Inhale 2 puffs into the lungs every 4 (four) hours. Patient uses this medication for shortness of breath.   1 Inhaler   1   . antipyrine-benzocaine (AURALGAN) otic solution   Both Ears   Place 3 drops into both ears every 2 (two) hours as needed for pain.   10 mL   0   . citalopram (CELEXA) 20 MG tablet   Oral   Take 40 mg by mouth daily.          . clonazePAM (KLONOPIN) 1 MG tablet   Oral   Take 1 mg by mouth 3 (three) times daily.         . cyclobenzaprine (FLEXERIL) 5 MG tablet   Oral   Take 1 tablet (5 mg total) by mouth 3 (three) times daily as needed for muscle spasms.   20 tablet   0   .  Fluticasone-Salmeterol (ADVAIR DISKUS) 100-50 MCG/DOSE AEPB   Inhalation   Inhale 1 puff into the lungs every 12 (twelve) hours. Patient is to use this medication for shortness of breath.   60 each   1   . HYDROcodone-acetaminophen (NORCO/VICODIN) 5-325 MG per tablet   Oral   Take 2 tablets by mouth every 4 (four) hours as needed.   20 tablet   0   . HYDROcodone-acetaminophen (NORCO/VICODIN) 5-325 MG per tablet   Oral   Take 2 tablets by mouth every 4 (four) hours as needed.   20 tablet   0   . ibuprofen (ADVIL,MOTRIN) 800 MG tablet   Oral   Take 1 tablet (800 mg total) by mouth every 8 (eight) hours as needed. Pain   20 tablet   0   . meloxicam (MOBIC) 7.5 MG  tablet   Oral   Take 1 tablet (7.5 mg total) by mouth daily.   20 tablet   0   . mometasone (NASONEX) 50 MCG/ACT nasal spray   Nasal   Place 2 sprays into the nose daily.   17 g   0     Use for three days only   . NON FORMULARY      Patient uses 2 liters of Oxygen at home         . oxyCODONE-acetaminophen (PERCOCET/ROXICET) 5-325 MG per tablet   Oral   Take 1-2 tablets by mouth every 6 (six) hours as needed for pain.   15 tablet   0   . pseudoephedrine (SUDAFED 12 HOUR) 120 MG 12 hr tablet   Oral   Take 1 tablet (120 mg total) by mouth every 12 (twelve) hours.   15 tablet   0   . tiotropium (SPIRIVA) 18 MCG inhalation capsule   Inhalation   Place 1 capsule (18 mcg total) into inhaler and inhale daily.   30 capsule   1   . traZODone (DESYREL) 50 MG tablet   Oral   Take 50 mg by mouth at bedtime.          BP 120/84  Pulse 79  Temp(Src) 98.6 F (37 C) (Oral)  Resp 16  Ht 5\' 8"  (1.727 m)  Wt 242 lb (109.77 kg)  BMI 36.80 kg/m2  SpO2 96% Physical Exam  Nursing note and vitals reviewed. Constitutional: She is oriented to person, place, and time. She appears well-developed and well-nourished.  HENT:  Head: Normocephalic and atraumatic.  Mouth/Throat: Oropharynx is clear and moist. No oropharyngeal exudate.  Eyes: Conjunctivae and EOM are normal. Pupils are equal, round, and reactive to light.  Neck: Normal range of motion. Neck supple.  Cardiovascular: Normal rate, regular rhythm, normal heart sounds and intact distal pulses.  Exam reveals no gallop and no friction rub.   No murmur heard. Pulmonary/Chest: Effort normal and breath sounds normal. No respiratory distress. She has no wheezes.  Abdominal: Soft. Bowel sounds are normal. There is no tenderness. There is no rebound and no guarding.  Musculoskeletal: Normal range of motion. She exhibits no edema and no tenderness.  LLE appears slightly larger than the right, the pt notes this is not changed from  baseline.   Neurological: She is alert and oriented to person, place, and time.  Skin: Skin is warm and dry.  Psychiatric: She has a normal mood and affect. Her behavior is normal.    ED Course  Procedures (including critical care time) Labs Review Labs Reviewed  CBC WITH DIFFERENTIAL  COMPREHENSIVE METABOLIC  PANEL  TROPONIN I   Imaging Review No results found.  EKG Interpretation    Date/Time:  Thursday September 21 2013 12:56:37 EST Ventricular Rate:  78 PR Interval:  144 QRS Duration: 98 QT Interval:  380 QTC Calculation: 433 R Axis:   61 Text Interpretation:  Normal sinus rhythm with sinus arrhythmia Normal ECG No significant change since last tracing Confirmed by Jlee Harkless  MD, Alexi Dorminey ([id number]) on 09/21/2013 1:23:03 PM            MDM   1. Atypical chest pain    1:25 PM 52 y.o. female who presents with intermittent sharp central chest pain which began 3 days ago. She states that it occurs at rest. She notes it is worsened by going out into the cold. She has had this pain several times today already and states that it usually last 10 minutes. She is asymptomatic currently on exam. She has several risk factors for cardiac disease including smoking, high cholesterol, hypertension, and family history. Her EKG is noncontributory. She is afebrile and vital signs are unremarkable here. Will get screening labs.  Pt had reproduction of her sx w/ drinking cold water on exam. I suspect her sx are gi related as she also had emesis the last 2 nights after laying supine. She states she is asx on exam now. I offered admission as she does have several RF's for cardiac disease despite non-contrib ecg/labs/imaging here. She refuses and would rather f/u w/ her pcp. She is low risk for MACE per HEART score. I think it is reasonable for her to f/u for outpt workup w/ her pcp. She notes the pain is not getting worse or more frequent and I doubt this is [place]. I gave her strong return precautions to  return for any worsening pain, inc freq, or new sx. She understands.   2:32 PM:  I have discussed the diagnosis/risks/treatment options with the patient and believe the pt to be eligible for discharge home to follow-up with pcp, call today to set up appt. We also discussed returning to the ED immediately if new or worsening sx occur. We discussed the sx which are most concerning (e.g., worsening pain, inc freq of pain, sob) that necessitate immediate return. Any new prescriptions provided to the patient are listed below.  New Prescriptions   RANITIDINE (ZANTAC) 150 MG CAPSULE    Take 1 capsule (150 mg total) by mouth daily.      Junius Argyle, MD 09/21/13 1438

## 2013-09-21 NOTE — ED Notes (Signed)
3 days of pain in central chest that comes and goes states last 2 nights as soon as she lies down she vomits

## 2013-09-21 NOTE — ED Notes (Signed)
Report given to Aleene Davidson rn

## 2013-09-21 NOTE — ED Notes (Signed)
Attempted 1 attempt at IV in RAC. No success.

## 2013-10-07 ENCOUNTER — Emergency Department (HOSPITAL_BASED_OUTPATIENT_CLINIC_OR_DEPARTMENT_OTHER)
Admission: EM | Admit: 2013-10-07 | Discharge: 2013-10-07 | Disposition: A | Discharge status: Home / Self Care | Payer: Self-pay | Attending: Emergency Medicine | Admitting: Emergency Medicine

## 2013-10-07 ENCOUNTER — Encounter (HOSPITAL_BASED_OUTPATIENT_CLINIC_OR_DEPARTMENT_OTHER): Payer: Self-pay | Admitting: Emergency Medicine

## 2013-10-07 DIAGNOSIS — Z791 Long term (current) use of non-steroidal anti-inflammatories (NSAID): Secondary | ICD-10-CM | POA: Insufficient documentation

## 2013-10-07 DIAGNOSIS — F3289 Other specified depressive episodes: Secondary | ICD-10-CM | POA: Insufficient documentation

## 2013-10-07 DIAGNOSIS — J4489 Other specified chronic obstructive pulmonary disease: Secondary | ICD-10-CM | POA: Insufficient documentation

## 2013-10-07 DIAGNOSIS — J449 Chronic obstructive pulmonary disease, unspecified: Secondary | ICD-10-CM | POA: Insufficient documentation

## 2013-10-07 DIAGNOSIS — F17200 Nicotine dependence, unspecified, uncomplicated: Secondary | ICD-10-CM | POA: Insufficient documentation

## 2013-10-07 DIAGNOSIS — Z79899 Other long term (current) drug therapy: Secondary | ICD-10-CM | POA: Insufficient documentation

## 2013-10-07 DIAGNOSIS — M62838 Other muscle spasm: Secondary | ICD-10-CM | POA: Insufficient documentation

## 2013-10-07 DIAGNOSIS — M62830 Muscle spasm of back: Secondary | ICD-10-CM

## 2013-10-07 DIAGNOSIS — M5430 Sciatica, unspecified side: Secondary | ICD-10-CM | POA: Insufficient documentation

## 2013-10-07 DIAGNOSIS — M5432 Sciatica, left side: Secondary | ICD-10-CM

## 2013-10-07 DIAGNOSIS — F329 Major depressive disorder, single episode, unspecified: Secondary | ICD-10-CM | POA: Insufficient documentation

## 2013-10-07 HISTORY — DX: Other intervertebral disc degeneration, lumbar region without mention of lumbar back pain or lower extremity pain: M51.369

## 2013-10-07 HISTORY — DX: Other intervertebral disc degeneration, lumbar region: M51.36

## 2013-10-07 MED ORDER — HYDROCODONE-ACETAMINOPHEN 5-325 MG PO TABS: ORAL_TABLET | ORAL | Status: DC

## 2013-10-07 MED ORDER — HYDROMORPHONE HCL PF 1 MG/ML IJ SOLN
1.0000 mg | Freq: Once | INTRAMUSCULAR | Status: AC
  Administered 2013-10-07: 1 mg via INTRAMUSCULAR
  Filled 2013-10-07: qty 1

## 2013-10-07 NOTE — ED Provider Notes (Signed)
CSN: [id number]     Arrival date & time 10/07/13  1307 History  This chart was scribed for Gavin Pound. Oletta Lamas, MD by Dorothey Baseman, ED Scribe. This patient was seen in room MH03/MH03 and the patient's care was started at 3:26 PM.    Chief Complaint  Patient presents with  . Back Pain   The history is provided by the patient. No language interpreter was used.   HPI Comments: Shannon Stevens is a 52 y.o. female with a history of back pain and degenerative disc disease (received an MRI 3 years ago at [facility] ED) who presents to the Emergency Department complaining of a constant pain to the lower back that radiates down through the left buttocks and into the left leg onset 4 days ago. She states that the pain is exacerbated with laying down in certain positions for long periods and with walking. She denies any potential injury or trauma to the area or recent heavy lifting. She reports taking Naprosyn and ibuprofen 800 mg at home without relief. Patient was seen here in October for similar complaints and received x-rays that were negative. Patient reports allergies to ciprofloxacin, ketorolac, and Ultram. Patient states that she does not like the effects of morphine. She denies dysuria, hematuria, fever, chills, emesis. No difficulty with urination. Patient also has a history of COPD.   PCP- Dr. Janann August   Past Medical History  Diagnosis Date  . COPD (chronic obstructive pulmonary disease)   . Depression   . Back pain   . DDD (degenerative disc disease), lumbar    Past Surgical History  Procedure Laterality Date  . Cesarean section    . Cholecystectomy    . Abdominal hysterectomy     History reviewed. No pertinent family history. History  Substance Use Topics  . Smoking status: Current Every Day Smoker    Types: Cigarettes  . Smokeless tobacco: Not on file  . Alcohol Use: No   OB History   Grav Para Term Preterm Abortions TAB SAB Ect Mult Living                 Review of Systems  A  complete 10 system review of systems was obtained and all systems are negative except as noted in the HPI and PMH.   Allergies  Ciprofloxacin; Ketorolac tromethamine; Morphine and related; and Ultram  Home Medications   Current Outpatient Rx  Name  Route  Sig  Dispense  Refill  . citalopram (CELEXA) 20 MG tablet   Oral   Take 40 mg by mouth daily.          . clonazePAM (KLONOPIN) 1 MG tablet   Oral   Take 1 mg by mouth 3 (three) times daily.         . naproxen (NAPROSYN) 500 MG tablet   Oral   Take 500 mg by mouth 2 (two) times daily with a meal.         . albuterol (PROVENTIL HFA;VENTOLIN HFA) 108 (90 BASE) MCG/ACT inhaler   Inhalation   Inhale 2 puffs into the lungs every 4 (four) hours. Patient uses this medication for shortness of breath.   1 Inhaler   1   . HYDROcodone-acetaminophen (NORCO/VICODIN) 5-325 MG per tablet      1-2 tablets po q 6 hours prn moderate to severe pain   15 tablet   0   . ibuprofen (ADVIL,MOTRIN) 800 MG tablet   Oral   Take 1 tablet (800 mg total) by  mouth every 8 (eight) hours as needed. Pain   20 tablet   0   . ranitidine (ZANTAC) 150 MG capsule   Oral   Take 1 capsule (150 mg total) by mouth daily.   30 capsule   0    Triage Vitals: BP 132/90  Pulse 120  Temp(Src) 98.2 F (36.8 C) (Oral)  Resp 16  Ht 5\' 7"  (1.702 m)  Wt 242 lb (109.77 kg)  BMI 37.89 kg/m2  SpO2 98%  Physical Exam  Nursing note and vitals reviewed. Constitutional: She is oriented to person, place, and time. She appears well-developed and well-nourished. No distress.  HENT:  Head: Normocephalic and atraumatic.  Eyes: Conjunctivae are normal.  Neck: Normal range of motion. Neck supple.  Cardiovascular: Normal rate, regular rhythm and normal heart sounds.   Pulmonary/Chest: Effort normal and breath sounds normal. No respiratory distress.  Abdominal: Soft. Bowel sounds are normal. She exhibits no distension. There is no tenderness. There is no rebound  and no guarding.  Genitourinary:  No CVA tenderness.   Musculoskeletal: Normal range of motion.  Tenderness to palpation with spasm to the left lumbar paraspinal muscles and left upper gluteal cleft regions.  Neurological: She is alert and oriented to person, place, and time. She has normal reflexes. She displays normal reflexes.  Reflex Scores:      Patellar reflexes are 2+ on the right side and 2+ on the left side. Normal strength and sensation throughout. 5/5 strength with dorsiflexion and plantar flexion.  Skin: Skin is warm and dry. No rash noted.  Psychiatric: She has a normal mood and affect. Her behavior is normal.    ED Course  Procedures (including critical care time)  DIAGNOSTIC STUDIES: Oxygen Saturation is 98% on room air, normal by my interpretation.    COORDINATION OF CARE: 3:34 PM- Discussed that symptoms may be muscular in nature or due to sciatica. Advised patient to continue taking anti-inflammatory medications at home. Will discharge patient with pain medication to manage symptoms. Discussed treatment plan with patient at bedside and patient verbalized agreement.     Labs Review Labs Reviewed - No data to display Imaging Review No results found.  EKG Interpretation   None       MDM   1. Lumbar paraspinal muscle spasm   2. Sciatica of left side    I personally performed the services described in this documentation, which was scribed in my presence. The recorded information has been reviewed and considered.  I attempted to access Care Everywhere but unable to connect via computer, unable to verify pt's MRI results done previously.  Pt has never had CT or MRI of back or neck in [facility] system.   Gavin Pound. Oletta Lamas, MD 10/08/13 1232

## 2013-10-07 NOTE — Discharge Instructions (Signed)
 Sciatica Sciatica is pain, weakness, numbness, or tingling along the path of the sciatic nerve. The nerve starts in the lower back and runs down the back of each leg. The nerve controls the muscles in the lower leg and in the back of the knee, while also providing sensation to the back of the thigh, lower leg, and the sole of your foot. Sciatica is a symptom of another medical condition. For instance, nerve damage or certain conditions, such as a herniated disk or bone spur on the spine, pinch or put pressure on the sciatic nerve. This causes the pain, weakness, or other sensations normally associated with sciatica. Generally, sciatica only affects one side of the body. CAUSES   Herniated or slipped disc.  Degenerative disk disease.  A pain disorder involving the narrow muscle in the buttocks (piriformis syndrome).  Pelvic injury or fracture.  Pregnancy.  Tumor (rare). SYMPTOMS  Symptoms can vary from mild to very severe. The symptoms usually travel from the low back to the buttocks and down the back of the leg. Symptoms can include:  Mild tingling or dull aches in the lower back, leg, or hip.  Numbness in the back of the calf or sole of the foot.  Burning sensations in the lower back, leg, or hip.  Sharp pains in the lower back, leg, or hip.  Leg weakness.  Severe back pain inhibiting movement. These symptoms may get worse with coughing, sneezing, laughing, or prolonged sitting or standing. Also, being overweight may worsen symptoms. DIAGNOSIS  Your caregiver will perform a physical exam to look for common symptoms of sciatica. He or she may ask you to do certain movements or activities that would trigger sciatic nerve pain. Other tests may be performed to find the cause of the sciatica. These may include:  Blood tests.  X-rays.  Imaging tests, such as an MRI or CT scan. TREATMENT  Treatment is directed at the cause of the sciatic pain. Sometimes, treatment is not necessary  and the pain and discomfort goes away on its own. If treatment is needed, your caregiver may suggest:  Over-the-counter medicines to relieve pain.  Prescription medicines, such as anti-inflammatory medicine, muscle relaxants, or narcotics.  Applying heat or ice to the painful area.  Steroid injections to lessen pain, irritation, and inflammation around the nerve.  Reducing activity during periods of pain.  Exercising and stretching to strengthen your abdomen and improve flexibility of your spine. Your caregiver may suggest losing weight if the extra weight makes the back pain worse.  Physical therapy.  Surgery to eliminate what is pressing or pinching the nerve, such as a bone spur or part of a herniated disk. HOME CARE INSTRUCTIONS   Only take over-the-counter or prescription medicines for pain or discomfort as directed by your caregiver.  Apply ice to the affected area for 20 minutes, 3 4 times a day for the first 48 72 hours. Then try heat in the same way.  Exercise, stretch, or perform your usual activities if these do not aggravate your pain.  Attend physical therapy sessions as directed by your caregiver.  Keep all follow-up appointments as directed by your caregiver.  Do not wear high heels or shoes that do not provide proper support.  Check your mattress to see if it is too soft. A firm mattress may lessen your pain and discomfort. SEEK IMMEDIATE MEDICAL CARE IF:   You lose control of your bowel or bladder (incontinence).  You have increasing weakness in the lower back,  pelvis, buttocks, or legs.  You have redness or swelling of your back.  You have a burning sensation when you urinate.  You have pain that gets worse when you lie down or awakens you at night.  Your pain is worse than you have experienced in the past.  Your pain is lasting longer than 4 weeks.  You are suddenly losing weight without reason. MAKE SURE YOU:  Understand these  instructions.  Will watch your condition.  Will get help right away if you are not doing well or get worse. Document Released: 09/22/2001 Document Revised: 03/29/2012 Document Reviewed: 02/07/2012 [facility] Patient Information 2014 [place], [place].   Muscle Cramps and Spasms Muscle cramps and spasms occur when a muscle or muscles tighten and you have no control over this tightening (involuntary muscle contraction). They are a common problem and can develop in any muscle. The most common place is in the calf muscles of the leg. Both muscle cramps and muscle spasms are involuntary muscle contractions, but they also have differences:   Muscle cramps are sporadic and painful. They may last a few seconds to a quarter of an hour. Muscle cramps are often more forceful and last longer than muscle spasms.  Muscle spasms may or may not be painful. They may also last just a few seconds or much longer. CAUSES  It is uncommon for cramps or spasms to be due to a serious underlying problem. In many cases, the cause of cramps or spasms is unknown. Some common causes are:   Overexertion.   Overuse from repetitive motions (doing the same thing over and over).   Remaining in a certain position for a long period of time.   Improper preparation, form, or technique while performing a sport or activity.   Dehydration.   Injury.   Side effects of some medicines.   Abnormally low levels of the salts and ions in your blood (electrolytes), especially potassium and calcium. This could happen if you are taking water pills (diuretics) or you are pregnant.  Some underlying medical problems can make it more likely to develop cramps or spasms. These include, but are not limited to:   Diabetes.   Parkinson disease.   Hormone disorders, such as thyroid problems.   Alcohol abuse.   Diseases specific to muscles, joints, and bones.   Blood vessel disease where not enough blood is getting to the  muscles.  HOME CARE INSTRUCTIONS   Stay well hydrated. Drink enough water and fluids to keep your urine clear or pale yellow.  It may be helpful to massage, stretch, and relax the affected muscle.  For tight or tense muscles, use a warm towel, heating pad, or hot shower water directed to the affected area.  If you are sore or have pain after a cramp or spasm, applying ice to the affected area may relieve discomfort.  Put ice in a plastic bag.  Place a towel between your skin and the bag.  Leave the ice on for 15-20 minutes, 03-04 times a day.  Medicines used to treat a known cause of cramps or spasms may help reduce their frequency or severity. Only take over-the-counter or prescription medicines as directed by your caregiver. SEEK MEDICAL CARE IF:  Your cramps or spasms get more severe, more frequent, or do not improve over time.  MAKE SURE YOU:   Understand these instructions.  Will watch your condition.  Will get help right away if you are not doing well or get worse. Document Released:  03/20/2002 Document Revised: 01/23/2013 Document Reviewed: 09/14/2012 [facility] Patient Information 2014 [place], [place].   Narcotic and benzodiazepine use may cause drowsiness, slowed breathing or dependence.  Please use with caution and do not drive, operate machinery or watch young children alone while taking them.  Taking combinations of these medications or drinking alcohol will potentiate these effects.

## 2013-10-07 NOTE — ED Notes (Signed)
Pt having lower back pain radiating down left leg.  Pt having some weakness in left leg, numbness on bottom of feet.  Pt states she is having some bowel incontinence.

## 2013-10-08 ENCOUNTER — Encounter (HOSPITAL_BASED_OUTPATIENT_CLINIC_OR_DEPARTMENT_OTHER): Payer: Self-pay | Admitting: Emergency Medicine

## 2013-10-18 ENCOUNTER — Encounter (HOSPITAL_BASED_OUTPATIENT_CLINIC_OR_DEPARTMENT_OTHER): Payer: Self-pay | Admitting: Emergency Medicine

## 2013-10-18 ENCOUNTER — Emergency Department (HOSPITAL_BASED_OUTPATIENT_CLINIC_OR_DEPARTMENT_OTHER)
Admission: EM | Admit: 2013-10-18 | Discharge: 2013-10-18 | Disposition: A | Discharge status: Home / Self Care | Payer: Self-pay | Attending: Emergency Medicine | Admitting: Emergency Medicine

## 2013-10-18 DIAGNOSIS — M79609 Pain in unspecified limb: Secondary | ICD-10-CM | POA: Insufficient documentation

## 2013-10-18 DIAGNOSIS — R209 Unspecified disturbances of skin sensation: Secondary | ICD-10-CM | POA: Insufficient documentation

## 2013-10-18 DIAGNOSIS — M5137 Other intervertebral disc degeneration, lumbosacral region: Secondary | ICD-10-CM | POA: Insufficient documentation

## 2013-10-18 DIAGNOSIS — G8929 Other chronic pain: Secondary | ICD-10-CM | POA: Insufficient documentation

## 2013-10-18 DIAGNOSIS — F3289 Other specified depressive episodes: Secondary | ICD-10-CM | POA: Insufficient documentation

## 2013-10-18 DIAGNOSIS — M545 Low back pain, unspecified: Secondary | ICD-10-CM | POA: Insufficient documentation

## 2013-10-18 DIAGNOSIS — M549 Dorsalgia, unspecified: Secondary | ICD-10-CM

## 2013-10-18 DIAGNOSIS — F329 Major depressive disorder, single episode, unspecified: Secondary | ICD-10-CM | POA: Insufficient documentation

## 2013-10-18 DIAGNOSIS — J4489 Other specified chronic obstructive pulmonary disease: Secondary | ICD-10-CM | POA: Insufficient documentation

## 2013-10-18 DIAGNOSIS — J449 Chronic obstructive pulmonary disease, unspecified: Secondary | ICD-10-CM | POA: Insufficient documentation

## 2013-10-18 DIAGNOSIS — F17200 Nicotine dependence, unspecified, uncomplicated: Secondary | ICD-10-CM | POA: Insufficient documentation

## 2013-10-18 DIAGNOSIS — Z79899 Other long term (current) drug therapy: Secondary | ICD-10-CM | POA: Insufficient documentation

## 2013-10-18 DIAGNOSIS — Z791 Long term (current) use of non-steroidal anti-inflammatories (NSAID): Secondary | ICD-10-CM | POA: Insufficient documentation

## 2013-10-18 DIAGNOSIS — M51379 Other intervertebral disc degeneration, lumbosacral region without mention of lumbar back pain or lower extremity pain: Secondary | ICD-10-CM | POA: Insufficient documentation

## 2013-10-18 MED ORDER — NAPROXEN 500 MG PO TABS: 500.0000 mg | ORAL_TABLET | Freq: Two times a day (BID) | ORAL | Status: DC

## 2013-10-18 MED ORDER — HYDROCODONE-ACETAMINOPHEN 5-325 MG PO TABS: ORAL_TABLET | ORAL | Status: DC

## 2013-10-18 NOTE — ED Provider Notes (Deleted)
CSN: [id number]     Arrival date & time 10/18/13  1355 History   First MD Initiated Contact with Patient 10/18/13 1629     Chief Complaint  Patient presents with  . Back Pain   (Consider location/radiation/quality/duration/timing/severity/associated sxs/prior Treatment) Patient is a 53 y.o. female presenting with back pain. The history is provided by the patient. No language interpreter was used.  Back Pain Location:  Lumbar spine Quality:  Aching Radiates to:  Does not radiate Pain severity:  Moderate Pain is:  Same all the time Onset quality:  Sudden Timing:  Constant Progression:  Worsening Chronicity:  New Context: recent illness   Relieved by:  Nothing Worsened by:  Nothing tried Ineffective treatments:  None tried Associated symptoms: leg pain, numbness and paresthesias     Past Medical History  Diagnosis Date  . COPD (chronic obstructive pulmonary disease)   . Depression   . Back pain   . DDD (degenerative disc disease), lumbar    Past Surgical History  Procedure Laterality Date  . Cesarean section    . Cholecystectomy    . Abdominal hysterectomy     No family history on file. History  Substance Use Topics  . Smoking status: Current Every Day Smoker    Types: Cigarettes  . Smokeless tobacco: Not on file  . Alcohol Use: No   OB History   Grav Para Term Preterm Abortions TAB SAB Ect Mult Living                 Review of Systems  Musculoskeletal: Positive for back pain.  Neurological: Positive for numbness and paresthesias.    Allergies  Ciprofloxacin; Ketorolac tromethamine; Morphine and related; and Ultram  Home Medications   Current Outpatient Rx  Name  Route  Sig  Dispense  Refill  . HYDROCHLOROTHIAZIDE PO   Oral   Take by mouth.         . Rosuvastatin Calcium (CRESTOR PO)   Oral   Take by mouth.         Marland Kitchen albuterol (PROVENTIL HFA;VENTOLIN HFA) 108 (90 BASE) MCG/ACT inhaler   Inhalation   Inhale 2 puffs into the lungs every 4 (four)  hours. Patient uses this medication for shortness of breath.   1 Inhaler   1   . citalopram (CELEXA) 20 MG tablet   Oral   Take 40 mg by mouth daily.          . clonazePAM (KLONOPIN) 1 MG tablet   Oral   Take 1 mg by mouth 3 (three) times daily.         Marland Kitchen HYDROcodone-acetaminophen (NORCO/VICODIN) 5-325 MG per tablet      1-2 tablets po q 6 hours prn moderate to severe pain   15 tablet   0   . ibuprofen (ADVIL,MOTRIN) 800 MG tablet   Oral   Take 1 tablet (800 mg total) by mouth every 8 (eight) hours as needed. Pain   20 tablet   0   . naproxen (NAPROSYN) 500 MG tablet   Oral   Take 500 mg by mouth 2 (two) times daily with a meal.         . ranitidine (ZANTAC) 150 MG capsule   Oral   Take 1 capsule (150 mg total) by mouth daily.   30 capsule   0    BP 136/84  Pulse 94  Temp(Src) 98.1 F (36.7 C) (Oral)  Resp 18  Ht 5\' 8"  (1.727 m)  Wt 245 lb (  111.131 kg)  BMI 37.26 kg/m2  SpO2 100% Physical Exam  Nursing note and vitals reviewed. Constitutional: She appears well-developed and well-nourished.  HENT:  Head: Normocephalic and atraumatic.  Eyes: Pupils are equal, round, and reactive to light.  Cardiovascular: Normal rate.   Pulmonary/Chest: Effort normal and breath sounds normal.  Abdominal: Soft.  Musculoskeletal: She exhibits tenderness.  Tender lumbar diffusely  Neurological: She is alert.  Skin: Skin is warm.  Psychiatric: She has a normal mood and affect.    ED Course  Procedures (including critical care time) Labs Review Labs Reviewed - No data to display Imaging Review No results found.  EKG Interpretation   None       MDM   1. Chronic back pain    rx for voltaren and hydrocodone    Elson AreasLeslie K Leman Martinek, PA-C 10/18/13 1717  Lonia SkinnerLeslie K SerenaSofia, PA-C 10/27/13 1814  Lonia SkinnerLeslie K CircleSofia, PA-C 10/31/13 1429

## 2013-10-18 NOTE — Discharge Instructions (Signed)
Back Pain, Adult Low back pain is very common. About 1 in 5 people have back pain.The cause of low back pain is rarely dangerous. The pain often gets better over time.About half of people with a sudden onset of back pain feel better in just 2 weeks. About 8 in 10 people feel better by 6 weeks.  CAUSES Some common causes of back pain include:  Strain of the muscles or ligaments supporting the spine.  Wear and tear (degeneration) of the spinal discs.  Arthritis.  Direct injury to the back. DIAGNOSIS Most of the time, the direct cause of low back pain is not known.However, back pain can be treated effectively even when the exact cause of the pain is unknown.Answering your caregiver's questions about your overall health and symptoms is one of the most accurate ways to make sure the cause of your pain is not dangerous. If your caregiver needs more information, he or she may order lab work or imaging tests (X-rays or MRIs).However, even if imaging tests show changes in your back, this usually does not require surgery. HOME CARE INSTRUCTIONS For many people, back pain returns.Since low back pain is rarely dangerous, it is often a condition that people can learn to manageon their own.   Remain active. It is stressful on the back to sit or stand in one place. Do not sit, drive, or stand in one place for more than 30 minutes at a time. Take short walks on level surfaces as soon as pain allows.Try to increase the length of time you walk each day.  Do not stay in bed.Resting more than 1 or 2 days can delay your recovery.  Do not avoid exercise or work.Your body is made to move.It is not dangerous to be active, even though your back may hurt.Your back will likely heal faster if you return to being active before your pain is gone.  Pay attention to your body when you bend and lift. Many people have less discomfortwhen lifting if they bend their knees, keep the load close to their bodies,and  avoid twisting. Often, the most comfortable positions are those that put less stress on your recovering back.  Find a comfortable position to sleep. Use a firm mattress and lie on your side with your knees slightly bent. If you lie on your back, put a pillow under your knees.  Only take over-the-counter or prescription medicines as directed by your caregiver. Over-the-counter medicines to reduce pain and inflammation are often the most helpful.Your caregiver may prescribe muscle relaxant drugs.These medicines help dull your pain so you can more quickly return to your normal activities and healthy exercise.  Put ice on the injured area.  Put ice in a plastic bag.  Place a towel between your skin and the bag.  Leave the ice on for 15-20 minutes, 03-04 times a day for the first 2 to 3 days. After that, ice and heat may be alternated to reduce pain and spasms.  Ask your caregiver about trying back exercises and gentle massage. This may be of some benefit.  Avoid feeling anxious or stressed.Stress increases muscle tension and can worsen back pain.It is important to recognize when you are anxious or stressed and learn ways to manage it.Exercise is a great option. SEEK MEDICAL CARE IF:  You have pain that is not relieved with rest or medicine.  You have pain that does not improve in 1 week.  You have new symptoms.  You are generally not feeling well. SEEK   IMMEDIATE MEDICAL CARE IF:   You have pain that radiates from your back into your legs.  You develop new bowel or bladder control problems.  You have unusual weakness or numbness in your arms or legs.  You develop nausea or vomiting.  You develop abdominal pain.  You feel faint. Document Released: 09/28/2005 Document Revised: 03/29/2012 Document Reviewed: 02/16/2011 [employer] Patient Information 2014 [employer].  

## 2013-10-18 NOTE — ED Provider Notes (Signed)
Medical screening examination/treatment/procedure(s) were performed by non-physician practitioner and as supervising physician I was immediately available for consultation/collaboration.  EKG Interpretation   None         Gwyneth SproutWhitney Sherly Brodbeck, MD 10/18/13 2325

## 2013-10-18 NOTE — ED Notes (Signed)
Pt reports low back pain radiating down left leg.  She also reports left ankle "weakness".

## 2013-10-18 NOTE — ED Notes (Addendum)
C/o cont'd lower back pain radiates down left leg-states she was unable to get clinic appt until 11/07/13-concerned that she is still having pain, urinary incontinence and left ankle getting weaker"-denies injury reports "bulging disc" x 3 yrs-pt with steady gait-NAD

## 2013-10-30 NOTE — ED Provider Notes (Signed)
Medical screening examination/treatment/procedure(s) were performed by non-physician practitioner and as supervising physician I was immediately available for consultation/collaboration.  EKG Interpretation   None         Gwyneth SproutWhitney Jeyson Deshotel, MD 10/30/13 [phone]

## 2014-06-21 ENCOUNTER — Emergency Department (HOSPITAL_BASED_OUTPATIENT_CLINIC_OR_DEPARTMENT_OTHER)
Admission: EM | Admit: 2014-06-21 | Discharge: 2014-06-21 | Disposition: A | Discharge status: Home / Self Care | Payer: Self-pay | Attending: Emergency Medicine | Admitting: Emergency Medicine

## 2014-06-21 ENCOUNTER — Encounter (HOSPITAL_BASED_OUTPATIENT_CLINIC_OR_DEPARTMENT_OTHER): Payer: Self-pay | Admitting: Emergency Medicine

## 2014-06-21 DIAGNOSIS — M545 Low back pain, unspecified: Secondary | ICD-10-CM | POA: Insufficient documentation

## 2014-06-21 DIAGNOSIS — M51379 Other intervertebral disc degeneration, lumbosacral region without mention of lumbar back pain or lower extremity pain: Secondary | ICD-10-CM | POA: Insufficient documentation

## 2014-06-21 DIAGNOSIS — J4489 Other specified chronic obstructive pulmonary disease: Secondary | ICD-10-CM | POA: Insufficient documentation

## 2014-06-21 DIAGNOSIS — F329 Major depressive disorder, single episode, unspecified: Secondary | ICD-10-CM | POA: Insufficient documentation

## 2014-06-21 DIAGNOSIS — J449 Chronic obstructive pulmonary disease, unspecified: Secondary | ICD-10-CM | POA: Insufficient documentation

## 2014-06-21 DIAGNOSIS — F17200 Nicotine dependence, unspecified, uncomplicated: Secondary | ICD-10-CM | POA: Insufficient documentation

## 2014-06-21 DIAGNOSIS — F3289 Other specified depressive episodes: Secondary | ICD-10-CM | POA: Insufficient documentation

## 2014-06-21 DIAGNOSIS — Z79899 Other long term (current) drug therapy: Secondary | ICD-10-CM | POA: Insufficient documentation

## 2014-06-21 DIAGNOSIS — M5137 Other intervertebral disc degeneration, lumbosacral region: Secondary | ICD-10-CM | POA: Insufficient documentation

## 2014-06-21 MED ORDER — NAPROXEN 500 MG PO TABS: 500.0000 mg | ORAL_TABLET | Freq: Two times a day (BID) | ORAL | Status: DC

## 2014-06-21 MED ORDER — HYDROCODONE-ACETAMINOPHEN 5-325 MG PO TABS: 1.0000 | ORAL_TABLET | Freq: Four times a day (QID) | ORAL | Status: DC | PRN

## 2014-06-21 MED ORDER — CYCLOBENZAPRINE HCL 10 MG PO TABS: 10.0000 mg | ORAL_TABLET | Freq: Two times a day (BID) | ORAL | Status: AC | PRN

## 2014-06-21 NOTE — ED Provider Notes (Signed)
CSN: [id number]     Arrival date & time 06/21/14  1035 History   First MD Initiated Contact with Patient 06/21/14 1113     Chief Complaint  Patient presents with  . Back Pain     (Consider location/radiation/quality/duration/timing/severity/associated sxs/prior Treatment) Patient is a 53 y.o. female presenting with back pain. The history is provided by the patient.  Back Pain Associated symptoms: no abdominal pain, no chest pain, no fever, no numbness and no weakness    patient with long-standing history of some lower back pain problems. Patient with an exacerbation of the back over the past 2 months. Pain is left-sided low back radiates into the left hip. No left leg weakness or motor deficits. No specific injury. Patient has had CT scans of the back in the past which showed the degenerative disc disease.   Past Medical History  Diagnosis Date  . COPD (chronic obstructive pulmonary disease)   . Depression   . Back pain   . DDD (degenerative disc disease), lumbar    Past Surgical History  Procedure Laterality Date  . Cesarean section    . Cholecystectomy    . Abdominal hysterectomy     History reviewed. No pertinent family history. History  Substance Use Topics  . Smoking status: Current Every Day Smoker    Types: Cigarettes  . Smokeless tobacco: Not on file  . Alcohol Use: No   OB History   Grav Para Term Preterm Abortions TAB SAB Ect Mult Living                 Review of Systems  Constitutional: Negative for fever.  HENT: Negative for congestion.   Eyes: Negative for redness.  Respiratory: Negative for shortness of breath.   Cardiovascular: Negative for chest pain.  Gastrointestinal: Negative for abdominal pain.  Genitourinary: Negative for difficulty urinating.  Musculoskeletal: Positive for back pain.  Skin: Negative for rash.  Neurological: Negative for weakness and numbness.  Hematological: Does not bruise/bleed easily.  Psychiatric/Behavioral: Negative for  confusion.      Allergies  Ciprofloxacin; Ketorolac tromethamine; Morphine and related; and Ultram  Home Medications   Prior to Admission medications   Medication Sig Start Date End Date Taking? Authorizing Provider  albuterol (PROVENTIL HFA;VENTOLIN HFA) 108 (90 BASE) MCG/ACT inhaler Inhale 2 puffs into the lungs every 4 (four) hours. Patient uses this medication for shortness of breath. 11/28/12   Gerhard Munch, MD  citalopram (CELEXA) 20 MG tablet Take 40 mg by mouth daily.     Historical Provider, MD  clonazePAM (KLONOPIN) 1 MG tablet Take 1 mg by mouth 3 (three) times daily.    Historical Provider, MD  cyclobenzaprine (FLEXERIL) 10 MG tablet Take 1 tablet (10 mg total) by mouth 2 (two) times daily as needed for muscle spasms. 06/21/14   Vanetta Mulders, MD  HYDROCHLOROTHIAZIDE PO Take by mouth.    Historical Provider, MD  HYDROcodone-acetaminophen (NORCO/VICODIN) 5-325 MG per tablet 1-2 tablets po q 6 hours prn moderate to severe pain 10/18/13   Elson Areas, PA-C  HYDROcodone-acetaminophen (NORCO/VICODIN) 5-325 MG per tablet Take 1-2 tablets by mouth every 6 (six) hours as needed for moderate pain. 06/21/14   Vanetta Mulders, MD  ibuprofen (ADVIL,MOTRIN) 800 MG tablet Take 1 tablet (800 mg total) by mouth every 8 (eight) hours as needed. Pain 05/09/13   Elson Areas, PA-C  naproxen (NAPROSYN) 500 MG tablet Take 1 tablet (500 mg total) by mouth 2 (two) times daily with a meal. 10/18/13  Elson Areas, PA-C  naproxen (NAPROSYN) 500 MG tablet Take 1 tablet (500 mg total) by mouth 2 (two) times daily. 06/21/14   Vanetta Mulders, MD  ranitidine (ZANTAC) 150 MG capsule Take 1 capsule (150 mg total) by mouth daily. 09/21/13   Purvis Sheffield, MD  Rosuvastatin Calcium (CRESTOR PO) Take by mouth.    Historical Provider, MD   BP 139/80  Pulse 88  Temp(Src) 97.7 F (36.5 C) (Oral)  Resp 16  SpO2 100% Physical Exam  Nursing note and vitals reviewed. Constitutional: She is oriented to  person, place, and time. She appears well-developed and well-nourished. No distress.  HENT:  Head: Normocephalic and atraumatic.  Mouth/Throat: Oropharynx is clear and moist.  Eyes: Conjunctivae are normal. Pupils are equal, round, and reactive to light.  Cardiovascular: Normal rate, regular rhythm and normal heart sounds.   Pulmonary/Chest: Effort normal and breath sounds normal. No respiratory distress.  Abdominal: Soft. Bowel sounds are normal. There is no tenderness.  Musculoskeletal: Normal range of motion. She exhibits no edema and no tenderness.  Neurological: She is alert and oriented to person, place, and time. No cranial nerve deficit. She exhibits normal muscle tone. Coordination normal.  No tenderness to palpation along the lumbar spine no paraspinous muscle tenderness. Left foot without swelling neurovascularly intact. No numbness no weakness.  Skin: Skin is warm. No rash noted.    ED Course  Procedures (including critical care time) Labs Review Labs Reviewed - No data to display  Imaging Review No results found.   EKG Interpretation None      MDM   Final diagnoses:  Left-sided low back pain without sciatica    The patient with left-sided low back pain. No neuro focal deficits. Nothing consistent with sciatica. Patient's been having trouble with the back on and off for several years. Will treat symptomatically and with rest. Resource guide provided to help patient find a new regular Dr.  In addition patient was concerned about her blood pressure. Currently blood pressure reasonable followup blood pressure checks to ensure no evidence of hypertension.                                     Vanetta Mulders, MD 06/21/14 1214

## 2014-06-21 NOTE — Discharge Instructions (Signed)
Take medications as directed. Rest as much as possible off your feet. Resource guide provided below to help you find a regular doctor. Return for any new or worse symptoms.   Emergency Department Resource Guide 1) Find a Doctor and Pay Out of Pocket Although you won't have to find out who is covered by your insurance plan, it is a good idea to ask around and get recommendations. You will then need to call the office and see if the doctor you have chosen will accept you as a new patient and what types of options they offer for patients who are self-pay. Some doctors offer discounts or will set up payment plans for their patients who do not have insurance, but you will need to ask so you aren't surprised when you get to your appointment.  2) Contact Your Local Health Department Not all health departments have doctors that can see patients for sick visits, but many do, so it is worth a call to see if yours does. If you don't know where your local health department is, you can check in your phone book. The CDC also has a tool to help you locate your state's health department, and many state websites also have listings of all of their local health departments.  3) Find a Walk-in Clinic If your illness is not likely to be very severe or complicated, you may want to try a walk in clinic. These are popping up all over the country in pharmacies, drugstores, and shopping centers. They're usually staffed by nurse practitioners or physician assistants that have been trained to treat common illnesses and complaints. They're usually fairly quick and inexpensive. However, if you have serious medical issues or chronic medical problems, these are probably not your best option.  No Primary Care Doctor: - Call Health Connect at  [phone] - they can help you locate a primary care doctor that  accepts your insurance, provides certain services, etc. - Physician Referral Service- [phone]  Chronic Pain  Problems: Organization         Address  Phone   Notes  [facility]  [phone] Patients need to be referred by their primary care doctor.   Medication Assistance: Organization         Address  Phone   Notes  [facility] [address] [place] [place], [place] [address] [phone] --Must be a resident of [facility] -- Must have NO insurance coverage whatsoever (no Medicaid/ Medicare, etc.) -- The pt. MUST have a primary care doctor that directs their care regularly and follows them in the community   [facility]ist  [phone]   [employer]  [phone]    Agencies that provide inexpensive medical care: Organization         Address  Phone   Notes  [facility]  [phone]   [facility]    [phone]   [facility] [address] [place], [place] [address] [phone]   [facility] [address] [place], [place] [phone]   Planned Parenthood    [phone]   [facility]    [phone]   [facility]  [address], [place] Phone:  [phone], Fax:  [phone] Hours of Operation:  9 am - 6 pm, M-F.  Also accepts Medicaid/Medicare and self-pay.  [facility]  [address], [place] Phone: [phone], Fax: [phone]. Hours of Operation:  8:30 am - 5:30 pm, M-F.  Also accepts Medicaid and self-pay.  [facility] [address], [place] Phone: [phone]   [place], [place], [place] [phone], Ext. [phone] Mondays & Thursdays: 7-9 AM.  First 15 patients are seen on a first come, first serve basis.    [place] Providers:  Organization         Address  Phone   Notes  [facility] [address], [place] [phone] Also  accepts self-pay patients.  [facility] [address], [place]  [phone]   [place], [address], [place] [phone]   [facility] [address] [place] [phone]   Lucianne Lei [address], [place]   [phone] Only accepts [place] Access [place] patients after they have their name applied to their card.   Self-Pay (no insurance) in [facility]:  Organization         Address  Phone   Notes  Sickle Cell Patients, [facility] [employer] [phone]   [facility] Care [place] [phone]   [facility] [place]  [place], [place], [place] [phone]   [facility]/Dr. Osei-Bonsu  [address] [place] or [address], [place] [phone] Phone number for both [place] and [place] locations is the same.  [facility] [address], [place] [phone]   [facility] [address] [place] or [address] [phone]   [facility] [address] [place] [phone], phone; [phone], fax Sees patients 1st and 3rd Saturday of every month.  Must not qualify for public or private insurance (i.e. Medicaid, Medicare, [place] Health Choice, Veterans' Benefits)  Household income should be no more than 200% of the poverty level The clinic cannot treat you if you are pregnant or think you are pregnant  Sexually transmitted diseases are not treated at the clinic.    Dental Care: Organization         Address  Phone  Notes  [facility] Department of [facility] [phone] Accepts children up to age 58 who are enrolled in [place] or [place]; pregnant  women with a Medicaid card; and children who have applied for Medicaid or [place] Health Choice, but were declined, whose parents can pay a reduced fee at time of service.  [facility] Department of [facility]  [address], [place] [phone] Accepts children up to age 52 who are enrolled in [place] or [place]; pregnant women with a Medicaid card; and children who have applied for Medicaid or [place] Health Choice, but were declined, whose parents can pay a reduced fee at time of service.  [facility]  [place] [phone] Patients are seen by appointment only. Walk-ins are not accepted. [place] will see patients 8 years of age and older. Monday - Tuesday (8am-5pm) Most Wednesdays (8:30-5pm) $30 per visit, cash only  [place]  PROGRAM  [address], [facility] [phone] Patients are seen by appointment only. Walk-ins are not accepted. [place] will see patients 73 years of age and older. One Wednesday Evening (Monthly: Volunteer Based).  $30 per visit, cash only  [place]  [phone] for adults; Children under age 67, call Graduate Pediatric Dentistry at [phone]. Children aged 69-14, please call [phone] to request a pediatric application.  Dental services are provided in all areas of dental care including fillings, crowns and bridges, complete and partial dentures, implants, gum treatment, root canals, and extractions. Preventive care is also provided. Treatment is provided to both adults and children. Patients are selected via a lottery and there is often a waiting list.   [facility] [address], [place]  [phone] [email or web address]   [facility] [address] [place], [place] [phone], Ext. [phone] Second and Fourth Thursday of each month, opens at 6:30 AM; Clinic ends at 9 AM.  Patients are  seen on a first-come first-served basis, and a limited number are seen during each clinic.   [facility]  [address] [place], [place] [phone]   Eligibility Requirements You must have lived in [place], [place], or [place] counties for at least the last three months.   You cannot be eligible for state or federal sponsored [employer], including [employer], [place], or [employer].   You generally cannot be eligible for healthcare insurance through your employer.    How to apply: Eligibility screenings are held every Tuesday and Wednesday afternoon from 1:00 pm until 4:00 pm. You do not need an appointment for the interview!  [facility] [address], [place], [place]   [place] Department  [place]  [phone]    Behavioral Health Resources in the Community: Intensive Outpatient Programs Organization         Address  Phone  Notes  [place]. [address], [place], [place] [phone]   [facility] Outpatient [address] [place], [place]   ADS: [facility] [address], [place], [place]   [place] [address]  [place], [place] or [phone]   Substance Abuse Resources Organization         Address  Phone  Notes  Al[facility] and Drug Services  [phone]   [place]  [phone]   [facility] [place]   Chinita Pester  [phone]   Residential & Outpatient Substance Abuse Program  [phone]   Psychological Services Organization         Address  Phone  Notes  [facility] [place]  [place]  [phone]   [place] [address], [place] or [phone]    Mobile Crisis  Teams Organization         Address  Phone  Notes  [facility], Mobile Crisis Care Unit  [phone]   [facility]  [address] [place], [place]   [place] [address], [place] [phone]    Self-Help/Support Groups Organization         Address  Phone             Notes  [place]  of [place] - variety of support groups  [place] Call for more information  Narcotics Anonymous (NA), [facility] [address], [employer] [place]  2 meetings at this location   Special educational needs teacher         Address  Phone  Notes  [facility] [place],    [place]  [phone]   [facility]  [address], [place] [address]4008, [place], [place]   [place], [place] [phone] Admissions: 8am-3pm M-F  Incentives Substance [place] [address],    [place], [place] [phone]   [facility] [address] [place], [place], [place]   [facility] [address]  [place], [place]   [facility] - Intensive Outpatient [address]een [address], [place], [place]   [facility] ([place] [address].,  [place], [place] [phone] or [phone]   [facility] (RTS) [address], [place], [place] Accepts Medicaid  [facility] [place] [address],  [place] [phone] Substance Abuse/Addiction Treatment   [facility] Organization         Address  Phone  Notes  [facility]  [phone]   Domenic Schwab, PhD [address] [place], [place]   [phone] or [phone]   [facility] [place], [place] [phone]   [facility] [address], [place], [place] [phone]  Insurance/Medicaid/sponsorship through [facility] Families [address]                                    [place], [place] [phone] [place] [address], [place] [phone]    Dr. Adele Schilder  [phone]   [facility] [place] Dept. 1) [address]  2) [place] 3)  [place] [address], [place] [phone]  [phone]   [place] [phone] or [phone] (After Hours)

## 2014-06-21 NOTE — ED Notes (Signed)
Pt amb to room 8 with quick steady gait in nad, showing pictures on her cell phone to staff. Pt reports 2 months of her increasing chronic back pain, pt states she was dx with ddd at [facility] years ago with mri.

## 2014-12-22 ENCOUNTER — Emergency Department (HOSPITAL_BASED_OUTPATIENT_CLINIC_OR_DEPARTMENT_OTHER)
Admission: EM | Admit: 2014-12-22 | Discharge: 2014-12-23 | Disposition: A | Discharge status: Home / Self Care | Payer: Self-pay | Attending: Emergency Medicine | Admitting: Emergency Medicine

## 2014-12-22 ENCOUNTER — Encounter (HOSPITAL_BASED_OUTPATIENT_CLINIC_OR_DEPARTMENT_OTHER): Payer: Self-pay

## 2014-12-22 ENCOUNTER — Emergency Department (HOSPITAL_BASED_OUTPATIENT_CLINIC_OR_DEPARTMENT_OTHER): Payer: Self-pay

## 2014-12-22 DIAGNOSIS — Z79899 Other long term (current) drug therapy: Secondary | ICD-10-CM | POA: Insufficient documentation

## 2014-12-22 DIAGNOSIS — Z720 Tobacco use: Secondary | ICD-10-CM | POA: Insufficient documentation

## 2014-12-22 DIAGNOSIS — F329 Major depressive disorder, single episode, unspecified: Secondary | ICD-10-CM | POA: Insufficient documentation

## 2014-12-22 DIAGNOSIS — Z791 Long term (current) use of non-steroidal anti-inflammatories (NSAID): Secondary | ICD-10-CM | POA: Insufficient documentation

## 2014-12-22 DIAGNOSIS — J449 Chronic obstructive pulmonary disease, unspecified: Secondary | ICD-10-CM | POA: Insufficient documentation

## 2014-12-22 DIAGNOSIS — M533 Sacrococcygeal disorders, not elsewhere classified: Secondary | ICD-10-CM | POA: Insufficient documentation

## 2014-12-22 LAB — CBC WITH DIFFERENTIAL/PLATELET
BASOS PCT: 1 % (ref 0–1)
Basophils Absolute: 0.1 10*3/uL (ref 0.0–0.1)
EOS PCT: 2 % (ref 0–5)
Eosinophils Absolute: 0.2 10*3/uL (ref 0.0–0.7)
HCT: 38.8 % (ref 36.0–46.0)
Hemoglobin: 13.1 g/dL (ref 12.0–15.0)
Lymphocytes Relative: 37 % (ref 12–46)
Lymphs Abs: 4.2 10*3/uL — ABNORMAL HIGH (ref 0.7–4.0)
MCH: 30 pg (ref 26.0–34.0)
MCHC: 33.8 g/dL (ref 30.0–36.0)
MCV: 88.8 fL (ref 78.0–100.0)
MONO ABS: 0.7 10*3/uL (ref 0.1–1.0)
Monocytes Relative: 6 % (ref 3–12)
Neutro Abs: 6.1 10*3/uL (ref 1.7–7.7)
Neutrophils Relative %: 54 % (ref 43–77)
Platelets: 286 10*3/uL (ref 150–400)
RBC: 4.37 MIL/uL (ref 3.87–5.11)
RDW: 13 % (ref 11.5–15.5)
WBC: 11.3 10*3/uL — ABNORMAL HIGH (ref 4.0–10.5)

## 2014-12-22 LAB — URINALYSIS, ROUTINE W REFLEX MICROSCOPIC
Bilirubin Urine: NEGATIVE
GLUCOSE, UA: NEGATIVE mg/dL
Ketones, ur: NEGATIVE mg/dL
NITRITE: NEGATIVE
PH: 6 (ref 5.0–8.0)
Protein, ur: NEGATIVE mg/dL
Specific Gravity, Urine: 1.019 (ref 1.005–1.030)
Urobilinogen, UA: 1 mg/dL (ref 0.0–1.0)

## 2014-12-22 LAB — URINE MICROSCOPIC-ADD ON

## 2014-12-22 NOTE — ED Provider Notes (Signed)
CSN: [id number]     Arrival date & time 12/22/14  2018 History   First MD Initiated Contact with Patient 12/22/14 2229     Chief Complaint  Patient presents with  . Pelvic Pain     (Consider location/radiation/quality/duration/timing/severity/associated sxs/prior Treatment) HPI Shannon Stevens is a 54 y.o. female with history of COPD, chronic back pain, diverticulitis, presents to emergency department complaining of abdominal pain and back pain. States symptoms started approximately a week ago. Worsening. She states pain is worsened with movement. Pain in the back is located in the left side, does not radiate. Pain in the abdomen is in the left side as well. She states she has been helping her mother move and has been lifting heavy boxes. She denies any numbness or weakness in her legs. No bladder or bowel control issues. Denies any dysuria or hematuria. No diarrhea. Admits to some nausea, no vomiting. States had recently been diagnosed with diverticulitis, states she did not finish full course of antibiotics but felt better. She states this pain feels worse.  Past Medical History  Diagnosis Date  . COPD (chronic obstructive pulmonary disease)   . Depression   . Back pain   . DDD (degenerative disc disease), lumbar    Past Surgical History  Procedure Laterality Date  . Cesarean section    . Cholecystectomy    . Abdominal hysterectomy     No family history on file. History  Substance Use Topics  . Smoking status: Current Every Day Smoker -- 0.50 packs/day    Types: Cigarettes  . Smokeless tobacco: Not on file  . Alcohol Use: No   OB History    No data available     Review of Systems  Constitutional: Negative for fever and chills.  Respiratory: Negative for cough, chest tightness and shortness of breath.   Cardiovascular: Negative for chest pain, palpitations and leg swelling.  Gastrointestinal: Positive for abdominal pain. Negative for nausea, vomiting, diarrhea, constipation  and blood in stool.  Genitourinary: Negative for dysuria and flank pain.  Musculoskeletal: Negative for myalgias, arthralgias, neck pain and neck stiffness.  Skin: Negative for rash.  Neurological: Negative for dizziness, weakness and headaches.  All other systems reviewed and are negative.     Allergies  Ciprofloxacin; Ketorolac tromethamine; Morphine and related; and Ultram  Home Medications   Prior to Admission medications   Medication Sig Start Date End Date Taking? Authorizing Provider  lisinopril (PRINIVIL,ZESTRIL) 10 MG tablet Take 10 mg by mouth daily.   Yes Historical Provider, MD  albuterol (PROVENTIL HFA;VENTOLIN HFA) 108 (90 BASE) MCG/ACT inhaler Inhale 2 puffs into the lungs every 4 (four) hours. Patient uses this medication for shortness of breath. 11/28/12   Gerhard Munchobert Lockwood, MD  citalopram (CELEXA) 20 MG tablet Take 40 mg by mouth daily.     Historical Provider, MD  clonazePAM (KLONOPIN) 1 MG tablet Take 1 mg by mouth 3 (three) times daily.    Historical Provider, MD  cyclobenzaprine (FLEXERIL) 10 MG tablet Take 1 tablet (10 mg total) by mouth 2 (two) times daily as needed for muscle spasms. 06/21/14   Vanetta MuldersScott Zackowski, MD  HYDROCHLOROTHIAZIDE PO Take by mouth.    Historical Provider, MD  HYDROcodone-acetaminophen (NORCO/VICODIN) 5-325 MG per tablet 1-2 tablets po q 6 hours prn moderate to severe pain 10/18/13   Elson AreasLeslie K Sofia, PA-C  HYDROcodone-acetaminophen (NORCO/VICODIN) 5-325 MG per tablet Take 1-2 tablets by mouth every 6 (six) hours as needed for moderate pain. 06/21/14   Vanetta MuldersScott Zackowski, MD  ibuprofen (ADVIL,MOTRIN) 800 MG tablet Take 1 tablet (800 mg total) by mouth every 8 (eight) hours as needed. Pain 05/09/13   Elson Areas, PA-C  naproxen (NAPROSYN) 500 MG tablet Take 1 tablet (500 mg total) by mouth 2 (two) times daily with a meal. 10/18/13   Elson Areas, PA-C  naproxen (NAPROSYN) 500 MG tablet Take 1 tablet (500 mg total) by mouth 2 (two) times daily. 06/21/14    Vanetta Mulders, MD  ranitidine (ZANTAC) 150 MG capsule Take 1 capsule (150 mg total) by mouth daily. 09/21/13   Purvis [place], MD  Rosuvastatin Calcium (CRESTOR PO) Take by mouth.    Historical Provider, MD   BP 145/82 mmHg  Pulse 92  Temp(Src) 98.6 F (37 C) (Oral)  Resp 18  Ht  (1.727 m)  Wt 249 lb (112.946 kg)  BMI 37.87 kg/m2  SpO2 97% Physical Exam  Constitutional: She appears well-developed and well-nourished. No distress.  HENT:  Head: Normocephalic.  Eyes: Conjunctivae are normal.  Neck: Neck supple.  Cardiovascular: Normal rate, regular rhythm and normal heart sounds.   Pulmonary/Chest: Effort normal and breath sounds normal. No respiratory distress. She has no wheezes. She has no rales.  Abdominal: Soft. Bowel sounds are normal. She exhibits no distension. There is tenderness. There is no rebound.  LLQ tenderness  Musculoskeletal: She exhibits no edema.  ttp over left perivertebral lumbar muscles  Neurological: She is alert.  Skin: Skin is warm and dry.  Psychiatric: She has a normal mood and affect. Her behavior is normal.  Nursing note and vitals reviewed.   ED Course  Procedures (including critical care time) Labs Review Labs Reviewed  URINALYSIS, ROUTINE W REFLEX MICROSCOPIC - Abnormal; Notable for the following:    Hgb urine dipstick TRACE (*)    Leukocytes, UA SMALL (*)    All other components within normal limits  CBC WITH DIFFERENTIAL/PLATELET - Abnormal; Notable for the following:    WBC 11.3 (*)    Lymphs Abs 4.2 (*)    All other components within normal limits  BASIC METABOLIC PANEL - Abnormal; Notable for the following:    Glucose, Bld 115 (*)    GFR calc non Af Amer 79 (*)    All other components within normal limits  URINE MICROSCOPIC-ADD ON    Imaging Review   EKG Interpretation None      MDM   Final diagnoses:  Sacroiliac joint pain     Patient with left lower back pain and also left lower quadrant abdominal pain.  Recent diverticulitis, states she did not finish all of the antibiotics. States "this pain feels way worse than what it had before." Given recent diverticulitis, will get CT abdomen and pelvis to rule out another flare versus possible complications.  Patient signed out to Dr. Norton Pastel pending CT scan  Filed Vitals:   12/22/14 2031 12/23/14 0029 12/23/14 0319  BP: 145/82 126/91   Pulse: 92 72 72  Temp: 98.6 F (37 C) 98 F (36.7 C) 98.6 F (37 C)  TempSrc: Oral Oral Oral  Resp: Height:  (1.727 m)    Weight: 249 lb (112.946 kg)    SpO2: 97% 97%      Jaynie Crumble, PA-C 12/24/14 0048  Arby Barrette, MD 12/28/14 1013

## 2014-12-22 NOTE — ED Notes (Signed)
Pt reports pelvic pain bilateral with radiation to left flank area.  Denies fever or diarrhea but reports nausea.  No dysuria.  Reports also that she has been helping mother move recently.

## 2014-12-23 LAB — BASIC METABOLIC PANEL
Anion gap: 7 (ref 5–15)
BUN: 18 mg/dL (ref 6–23)
CO2: 24 mmol/L (ref 19–32)
CREATININE: 0.83 mg/dL (ref 0.50–1.10)
Calcium: 9 mg/dL (ref 8.4–10.5)
Chloride: 107 mmol/L (ref 96–112)
GFR calc non Af Amer: 79 mL/min — ABNORMAL LOW (ref >90)
GLUCOSE: 115 mg/dL — AB (ref 70–99)
Potassium: 3.8 mmol/L (ref 3.5–5.1)
Sodium: 138 mmol/L (ref 135–145)

## 2014-12-23 MED ORDER — HYDROCODONE-ACETAMINOPHEN 5-325 MG PO TABS: 1.0000 | ORAL_TABLET | Freq: Four times a day (QID) | ORAL | Status: AC | PRN

## 2014-12-23 MED ORDER — IOHEXOL 300 MG/ML  SOLN
50.0000 mL | Freq: Once | INTRAMUSCULAR | Status: AC | PRN
  Administered 2014-12-22: 50 mL via ORAL

## 2014-12-23 MED ORDER — HYDROCODONE-ACETAMINOPHEN 5-325 MG PO TABS: 1.0000 | ORAL_TABLET | Freq: Four times a day (QID) | ORAL | Status: DC | PRN

## 2014-12-23 MED ORDER — IOHEXOL 300 MG/ML  SOLN
100.0000 mL | Freq: Once | INTRAMUSCULAR | Status: AC | PRN
  Administered 2014-12-23: 100 mL via INTRAVENOUS

## 2014-12-23 NOTE — ED Provider Notes (Signed)
Nursing notes and vitals signs, including pulse oximetry, reviewed.  Summary of this visit's results, reviewed by myself:  Labs:  Results for orders placed or performed during the hospital encounter of 12/22/14 (from the past 24 hour(s))  Urinalysis, Routine w reflex microscopic     Status: Abnormal   Collection Time: 12/22/14  8:40 PM  Result Value Ref Range   Color, Urine YELLOW YELLOW   APPearance CLEAR CLEAR   Specific Gravity, Urine 1.019 1.005 - 1.030   pH 6.0 5.0 - 8.0   Glucose, UA NEGATIVE NEGATIVE mg/dL   Hgb urine dipstick TRACE (A) NEGATIVE   Bilirubin Urine NEGATIVE NEGATIVE   Ketones, ur NEGATIVE NEGATIVE mg/dL   Protein, ur NEGATIVE NEGATIVE mg/dL   Urobilinogen, UA 1.0 0.0 - 1.0 mg/dL   Nitrite NEGATIVE NEGATIVE   Leukocytes, UA SMALL (A) NEGATIVE  Urine microscopic-add on     Status: None   Collection Time: 12/22/14  8:40 PM  Result Value Ref Range   Squamous Epithelial / LPF RARE RARE   WBC, UA 0-2 <3 WBC/hpf   RBC / HPF 3-6 <3 RBC/hpf   Bacteria, UA RARE RARE   Urine-Other MUCOUS PRESENT   CBC with Differential     Status: Abnormal   Collection Time: 12/22/14 11:15 PM  Result Value Ref Range   WBC 11.3 (H) 4.0 - 10.5 K/uL   RBC 4.37 3.87 - 5.11 MIL/uL   Hemoglobin 13.1 12.0 - 15.0 g/dL   HCT 16.138.8 09.636.0 - 04.546.0 %   MCV 88.8 78.0 - 100.0 fL   MCH 30.0 26.0 - 34.0 pg   MCHC 33.8 30.0 - 36.0 g/dL   RDW 40.913.0 81.111.5 - 91.415.5 %   Platelets 286 150 - 400 K/uL   Neutrophils Relative % 54 43 - 77 %   Neutro Abs 6.1 1.7 - 7.7 K/uL   Lymphocytes Relative 37 12 - 46 %   Lymphs Abs 4.2 (H) 0.7 - 4.0 K/uL   Monocytes Relative 6 3 - 12 %   Monocytes Absolute 0.7 0.1 - 1.0 K/uL   Eosinophils Relative 2 0 - 5 %   Eosinophils Absolute 0.2 0.0 - 0.7 K/uL   Basophils Relative 1 0 - 1 %   Basophils Absolute 0.1 0.0 - 0.1 K/uL  Basic metabolic panel     Status: Abnormal   Collection Time: 12/22/14 11:15 PM  Result Value Ref Range   Sodium 138 135 - 145 mmol/L   Potassium  3.8 3.5 - 5.1 mmol/L   Chloride 107 96 - 112 mmol/L   CO2 24 19 - 32 mmol/L   Glucose, Bld 115 (H) 70 - 99 mg/dL   BUN 18 6 - 23 mg/dL   Creatinine, Ser 7.820.83 0.50 - 1.10 mg/dL   Calcium 9.0 8.4 - 95.610.5 mg/dL   GFR calc non Af Amer 79 (L) >90 mL/min   GFR calc Af Amer >90 >90 mL/min   Anion gap 7 5 - 15    Imaging Studies: Ct Abdomen Pelvis W Contrast  12/23/2014   CLINICAL DATA:  Acute onset of left flank pain and bilateral pelvic pain. Nausea. Mild leukocytosis. Initial encounter.  EXAM: CT ABDOMEN AND PELVIS WITH CONTRAST  TECHNIQUE: Multidetector CT imaging of the abdomen and pelvis was performed using the standard protocol following bolus administration of intravenous contrast.  CONTRAST:  50mL OMNIPAQUE IOHEXOL 300 MG/ML SOLN, 100mL OMNIPAQUE IOHEXOL 300 MG/ML SOLN  COMPARISON:  None.  FINDINGS: The visualized lung bases are clear.  The  liver and spleen are unremarkable in appearance. The patient is status post cholecystectomy, with clips noted at the gallbladder fossa. The pancreas and adrenal glands are unremarkable.  The kidneys are unremarkable in appearance. There is no evidence of hydronephrosis. No renal or ureteral stones are seen. No perinephric stranding is appreciated.  No free fluid is identified. The small bowel is unremarkable in appearance. The stomach is within normal limits. No acute vascular abnormalities are seen. Scattered calcification is seen along the abdominal aorta and its branches.  A small anterior abdominal wall hernia is noted superior to the umbilicus, containing only fat.  The appendix is normal in caliber and contains trace air and contrast, without evidence for appendicitis. Contrast progresses to the level of the splenic flexure of the colon. The colon is otherwise grossly unremarkable, aside from mild fatty infiltration of the wall of the sigmoid colon.  The bladder is mildly distended and grossly unremarkable in appearance. The patient is status post  hysterectomy. No suspicious adnexal masses are seen. The ovaries are relatively symmetric. No inguinal lymphadenopathy is seen.  No acute osseous abnormalities are identified. Mild facet disease is noted at the lower lumbar spine.  IMPRESSION: 1. No acute abnormality seen within the abdomen or pelvis. 2. Mild fatty infiltration of the wall of the sigmoid colon appears stable from 2011 and may reflect mild sequelae of chronic inflammation. 3. Small anterior abdominal wall hernia noted superior to the umbilicus, containing only fat. 4. Scattered calcification along the abdominal aorta and its branches.   Electronically Signed   By: Roanna Raider M.D.   On: 12/23/2014 00:57    Medical screening examination/treatment/procedure(s) were conducted as a shared visit with non-physician practitioner(s) and myself.  I personally evaluated the patient during the encounter.  1:09 AM Patient advised of CT findings. She is tender in her SI joints bilaterally, left greater than right. She describes her pain as being primarily in her back with lesser pain in her lower abdomen bilaterally. Given CT findings it is likely that this represents sacroiliac joint pain.    Paula Libra, MD 12/23/14 0110
